# Patient Record
Sex: Male | Born: 1950
Health system: Southern US, Community
[De-identification: ages and names within clinical notes are randomized; demographics above are authoritative.]

## PROBLEM LIST (undated history)

## (undated) DIAGNOSIS — J309 Allergic rhinitis, unspecified: Secondary | ICD-10-CM

## (undated) DIAGNOSIS — E119 Type 2 diabetes mellitus without complications: Secondary | ICD-10-CM

## (undated) DIAGNOSIS — G2581 Restless legs syndrome: Secondary | ICD-10-CM

## (undated) DIAGNOSIS — K219 Gastro-esophageal reflux disease without esophagitis: Secondary | ICD-10-CM

## (undated) DIAGNOSIS — C801 Malignant (primary) neoplasm, unspecified: Secondary | ICD-10-CM

## (undated) DIAGNOSIS — E785 Hyperlipidemia, unspecified: Secondary | ICD-10-CM

## (undated) DIAGNOSIS — J45909 Unspecified asthma, uncomplicated: Secondary | ICD-10-CM

## (undated) DIAGNOSIS — I10 Essential (primary) hypertension: Secondary | ICD-10-CM

## (undated) DIAGNOSIS — J449 Chronic obstructive pulmonary disease, unspecified: Secondary | ICD-10-CM

## (undated) DIAGNOSIS — Z889 Allergy status to unspecified drugs, medicaments and biological substances status: Secondary | ICD-10-CM

## (undated) HISTORY — PX: VASECTOMY: SHX75

---

## 1983-11-29 HISTORY — PX: VASECTOMY: SHX75

## 2005-01-04 ENCOUNTER — Ambulatory Visit: Payer: Self-pay | Admitting: Gastroenterology

## 2005-01-04 LAB — HM COLONOSCOPY

## 2007-01-15 ENCOUNTER — Ambulatory Visit: Payer: Self-pay | Admitting: Unknown Physician Specialty

## 2007-11-02 ENCOUNTER — Ambulatory Visit: Payer: Self-pay | Admitting: Critical Care Medicine

## 2007-11-02 DIAGNOSIS — I10 Essential (primary) hypertension: Secondary | ICD-10-CM | POA: Insufficient documentation

## 2007-11-02 DIAGNOSIS — J329 Chronic sinusitis, unspecified: Secondary | ICD-10-CM | POA: Insufficient documentation

## 2007-11-02 DIAGNOSIS — J309 Allergic rhinitis, unspecified: Secondary | ICD-10-CM | POA: Insufficient documentation

## 2007-11-29 HISTORY — PX: NASAL SINUS SURGERY: SHX719

## 2011-01-27 ENCOUNTER — Ambulatory Visit: Payer: Self-pay | Admitting: General Practice

## 2011-02-21 ENCOUNTER — Ambulatory Visit: Payer: Self-pay | Admitting: Specialist

## 2011-04-12 NOTE — Assessment & Plan Note (Signed)
[facility]                             PULMONARY OFFICE NOTE   Macdonald, Brian                      MRN:          [id number]  DATE:11/02/2007                            DOB:          July 24, 1951    CHIEF COMPLAINT:  Wheezing and cough.   HISTORY OF PRESENT ILLNESS:  This is a 60 year old white male who is  here for evaluation of wheezing and cyclic cough.  Symptoms have been  going on since 08/2006.  He had a viral type syndrome at that time.  Had  initially a productive cough on an intermittent basis; sometimes  productive of green in the mornings.  He noted some hoarseness.  Denied  any dysphagia or sore throat.  There was no nocturnal cough.  He did  have some wheezing that appeared to be more in the upper portion of the  chest.  He is not particularly short of breath.  He has no chest pain.  he was given prednisone 5 or 6 times over the past year with some  improvement, and also has been on Advair now for 3 months with no real  change.  He smoked a cigar and do so monthly.  He has done this for the  last 5-10 years.  He works in the Engineer, mining at [employer].  No real exposure or environmental exposure history.  The  patient denies any irregular heart beats, acid symptoms.  There is no  loss of appetite.  No change in weight.  There has been no abdominal  pain, difficulty swallowing.  There is no sore throat, headaches, nasal  congestion, sneezing, itching, earache.  There has been no anxiety,  depression, hand or foot swelling, joint stiffness or pain.  The  patient's symptoms have progressed, and to the point where he is here  for further evaluation.  He had been seen previously by Dr. Meredeth Ide at  the [facility], who felt that he had asthmatic bronchitis as his  primary diagnosis.  He was tried on Xyzal as well as Advair and  albuterol p.r.n.  He really did not improve with these measures.  Pulmonary functions had  previously been done in the [facility] on  April 2008, showed essentially normal spirometry with not even a  reduction in FEV of 2575.  The June 2008 pulmonary functions also  confirm essentially normal pulmonary function studies.  Chest x-rays  have also been obtained, and are unremarkable.   Patient is referred for further evaluation.   PAST MEDICAL HISTORY:  Hypertension.  No history of emphysema, diabetes,  hypercholesterolemia.  There is no history of cancer, renal disease,  sleep disordered breathing.  He had sinus surgery in 12/2006 with  septoplasty being performed by the [facility].   MEDICATION ALLERGIES:  None.   CURRENT MEDICATIONS:  1. Advair 500/50 one spray b.i.d.  2. Amlodipine 10 mg daily.  3. Benazepril 40 mg daily.  4. Vytorin 10/20 daily.  5. Oracea 40 mg daily.  6. Bystolic 5 mg daily.   SOCIAL HISTORY:  Smoking history as noted above.  FAMILY HISTORY:  Father had bladder and prostate cancer.   REVIEW OF SYSTEMS:  Otherwise noncontributory.   PHYSICAL EXAM:  This is a middle-aged male, slightly overweight, in no  distress.  Temp 97.6, weight 245 pounds, blood pressure 120/80, pulse 61,  saturation 98 percent on room air.  CHEST:  Distant breath sounds, and no evidence of wheeze, rale or  rhonchi.  CARDIAC:  A regular rate and rhythm without S3.  Normal S1, S2.  ABDOMEN:  Soft, nontender.  EXTREMITIES:  No edema, clubbing or venous disease.  SKIN:  Clear.  NEUROLOGIC EXAM:  Intact.  HEENT:  No jugular venous distention, lymphadenopathy.  Oropharynx  clear.  NECK:  Supple.   Nares did show mild nasal inflammation without overt purulence.  Oropharynx was clear.   LABORATORY DATA:  Chest x-ray was clear.  Spirometry as noted above.  We  repeated spirometry today in the office, and again confirms the presence  of normal spirometry.  The FEV 2570 is normal at 100% predicted, FEV1 is  89% predicted, FVC of 88% predicted.   IMPRESSION:   Cyclic cough with upper airway instability syndrome, and no  true evidence of asthma or lower airway obstruction.  The patient has a  cyclic cough on the basis of a reflux mechanism likely, as well as  postnasal drip syndrome and probable allergic rhinitis.  He had overt  pseudowheeze on examination as noted.   RECOMMENDATIONS:  Obtain sinus CT scan.  Discontinue Advair.  Discontinue benazepril, as this could be likely contributing to cough  and lowering cough threshold as an ACE inhibitor, and replace this with  Benicar 40 mg daily.  Begin Astelin nasal spray 2 sprays b.i.d. each  nostril.  Begin the cyclic cough protocol with Tussionex, Tessalon  Perles.  Hold off on bronchoscopy, but may yet consider this.  We will  see the patient back in return followup in 1 month.     Brian Cradle Delford Field, MD, [facility]  Electronically Signed    PEW/MedQ  DD: 11/02/2007  DT: 11/03/2007  Job #: [id number]   cc:   Julieanne Manson

## 2011-09-22 ENCOUNTER — Ambulatory Visit: Payer: Self-pay | Admitting: Family Medicine

## 2011-11-13 IMAGING — CT CT CHEST W/ CM
1 series · 15 of 31 positions shown, 19 images · non-contrast
Comparison: none

REASON FOR EXAM: high resolution cough for several years  diabetic
COMMENTS:

[Series 2: hi res 5.0 i41f · axial · 0.79mm/px · z∈[-846,-546]mm · 15 of 66 slices shown, 19 images]
[im 3/66  mediastinal]
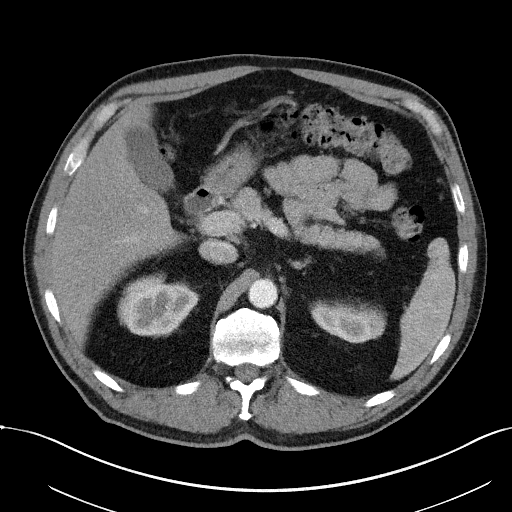
[im 3/66  lung]
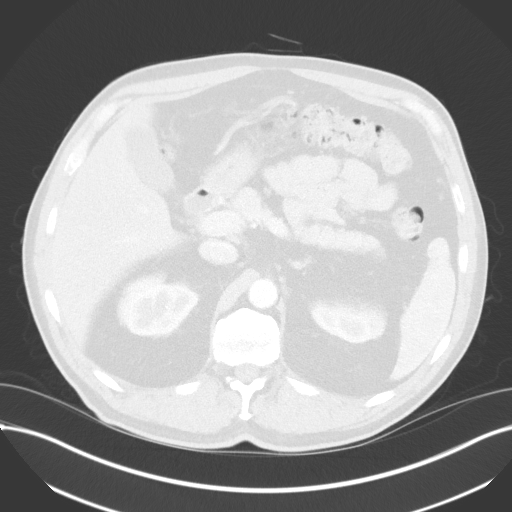
[im 8/66  lung]
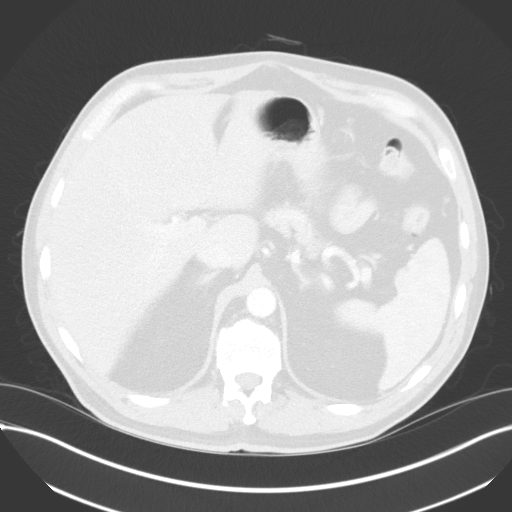
[im 13/66  lung]
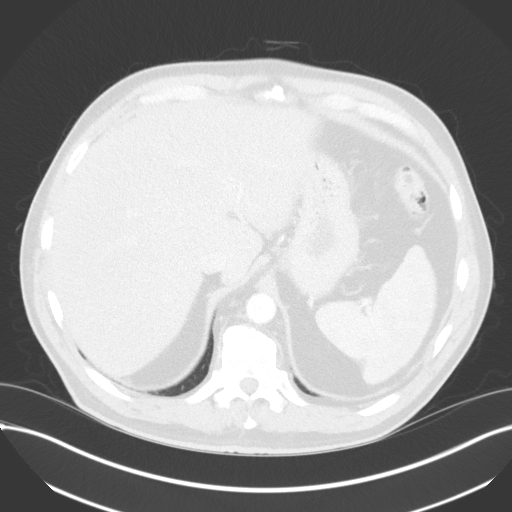
[im 15/66  lung]
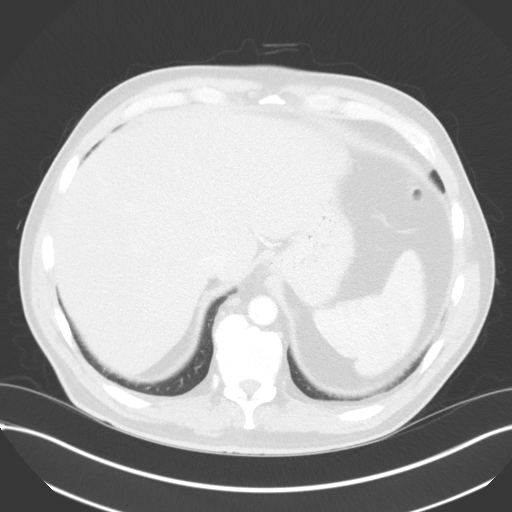
[im 20/66  mediastinal]
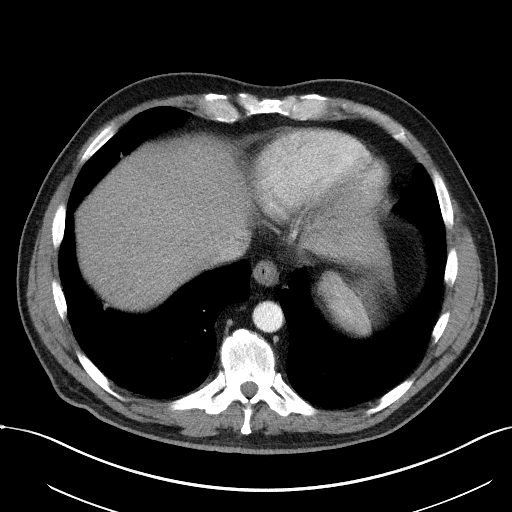
[im 20/66  lung]
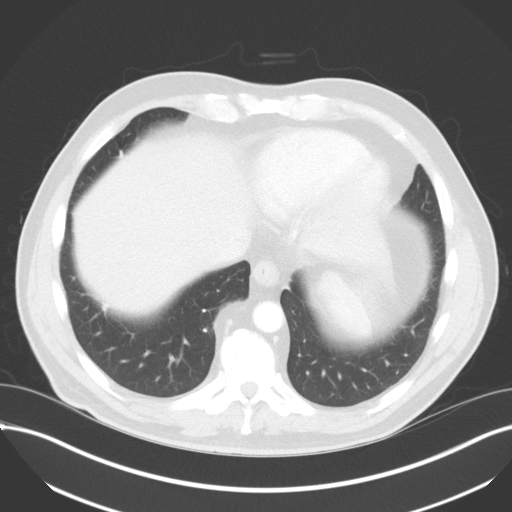
[im 25/66  lung]
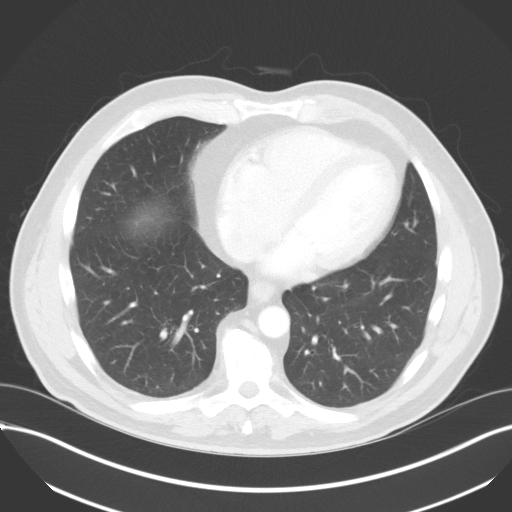
[im 29/66  lung]
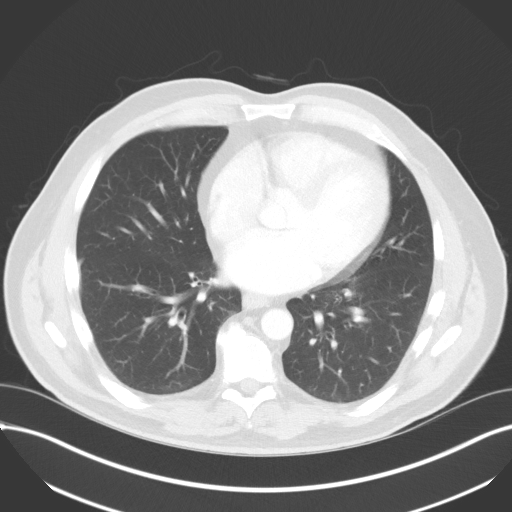
[im 34/66  lung]
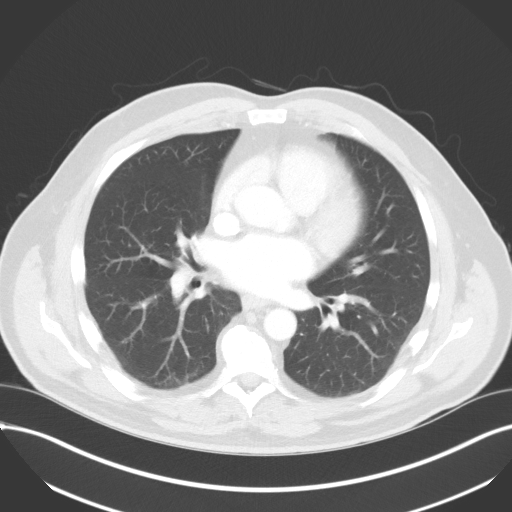
[im 37/66  mediastinal]
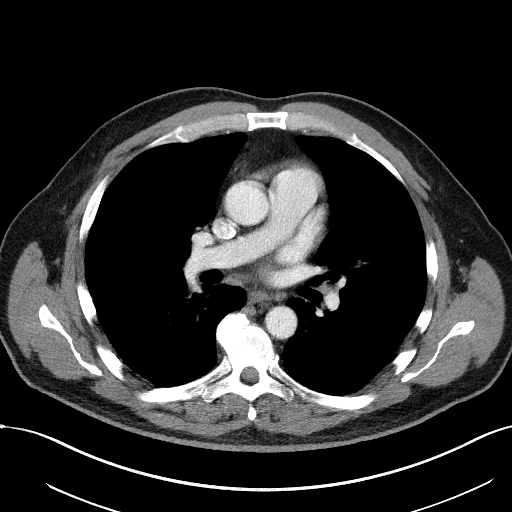
[im 37/66  lung]
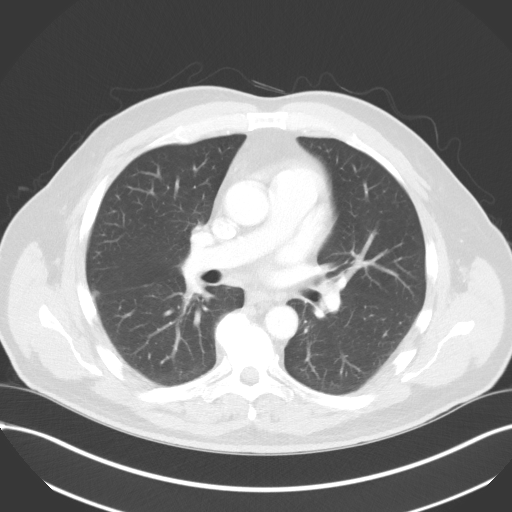
[im 40/66  lung]
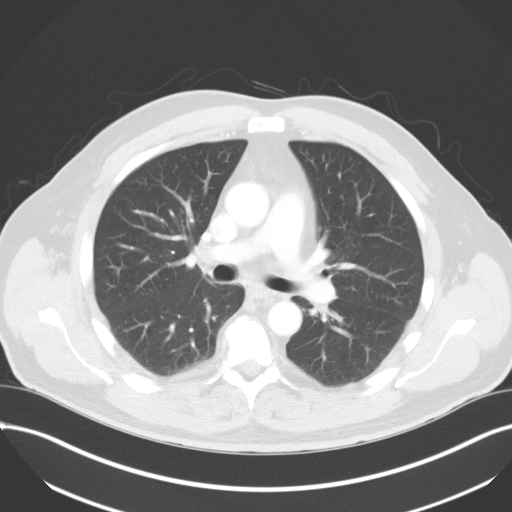
[im 44/66  lung]
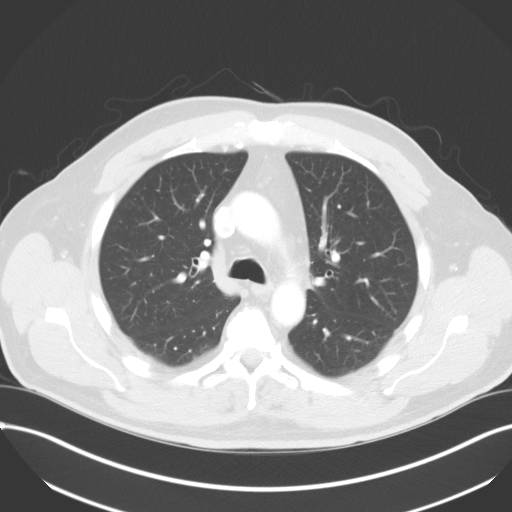
[im 49/66  lung]
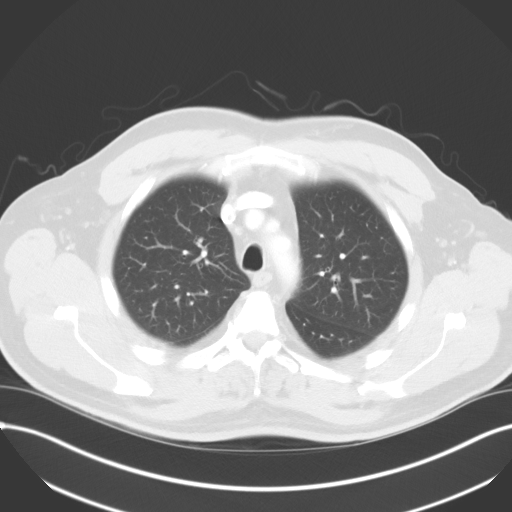
[im 53/66  mediastinal]
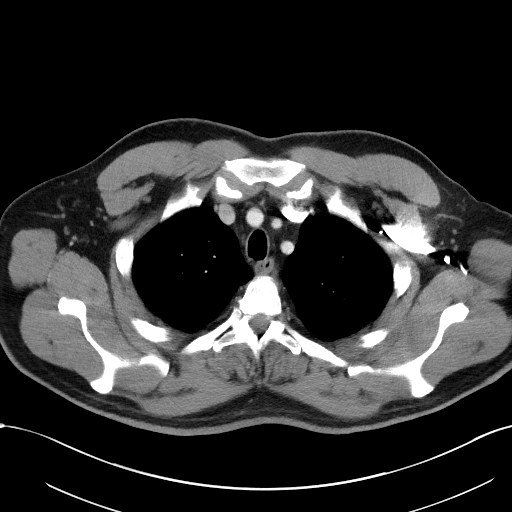
[im 53/66  lung]
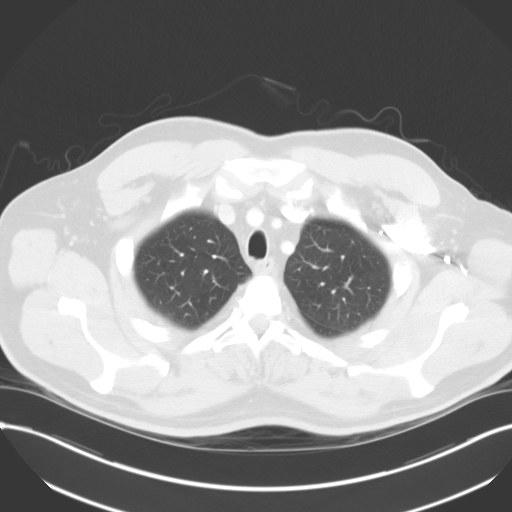
[im 58/66  lung]
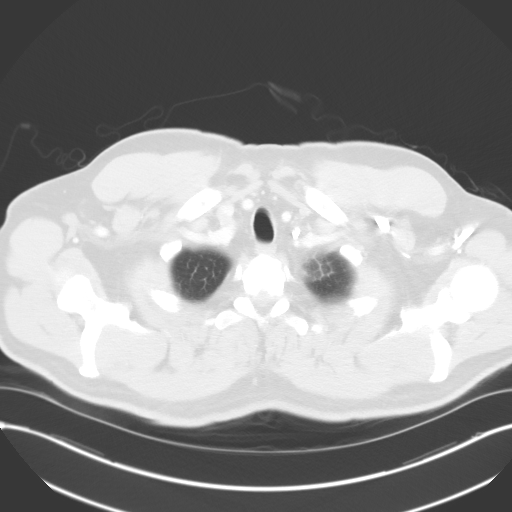
[im 63/66  lung]
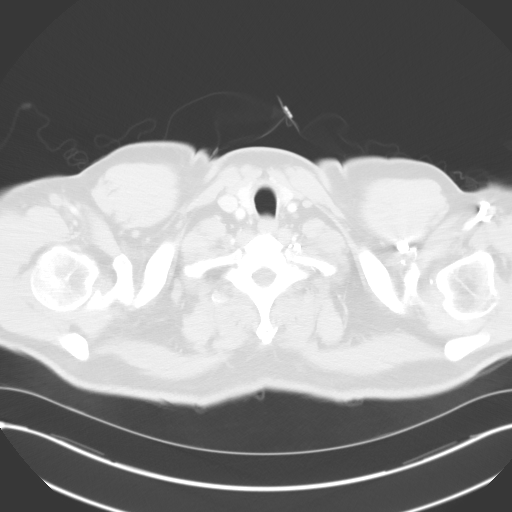

[15 of 31 positions shown; findings below may reference images not displayed]

PROCEDURE:     KCT - KCT CHEST WITH CONTRAST  - February 21, 2011  [DATE]

RESULT:     CT of the chest is performed utilizing 75 mL of 6sovue-OTG
iodinated intravenous contrast with images reconstructed at 5 mm slice
thickness in the axial plane. There is no previous similar study for
comparison.

Minimal subpleural nodular density is present in the left lower lobe on
image 44. No malignant characteristics are present. This area measures
approximately 3.0 mm in diameter. The lungs are otherwise clear.
High-resolution reconstructions demonstrate no significant septal thickening
or evidence of bronchiectasis. There is some slight peribronchial thickening
in the lower lobes centrally which could represent bronchitis. Correlate
clinically. No bullous disease is evident. There is no pneumothorax or
pleural effusion. The included upper abdominal structures appear
unremarkable.
IMPRESSION: Mild peribronchial thickening especially in the lower lobes bilaterally.
Correlate for possible bronchitis. Minimal subpleural nodular density in the
left lower lobe.

## 2011-12-30 HISTORY — PX: SKIN CANCER EXCISION: SHX779

## 2013-09-04 ENCOUNTER — Ambulatory Visit: Payer: Self-pay | Admitting: Family Medicine

## 2014-06-19 LAB — HEPATIC FUNCTION PANEL
ALK PHOS: 96 U/L (ref 25–125)
ALT: 22 U/L (ref 10–40)
AST: 24 U/L (ref 14–40)
BILIRUBIN, TOTAL: 0.7 mg/dL

## 2014-06-19 LAB — LIPID PANEL
Cholesterol: 129 mg/dL (ref 0–200)
HDL: 34 mg/dL — AB (ref 35–70)
LDL Cholesterol: 61 mg/dL
LDL/HDL RATIO: 1.8
TRIGLYCERIDES: 171 mg/dL — AB (ref 40–160)

## 2014-06-19 LAB — BASIC METABOLIC PANEL
BUN: 16 mg/dL (ref 4–21)
CREATININE: 1.1 mg/dL (ref 0.6–1.3)
Glucose: 130 mg/dL
Potassium: 4.6 mmol/L (ref 3.4–5.3)
Sodium: 139 mmol/L (ref 137–147)

## 2014-06-19 LAB — CBC AND DIFFERENTIAL
HCT: 45 % (ref 41–53)
Hemoglobin: 15.5 g/dL (ref 13.5–17.5)
Neutrophils Absolute: 5 /uL
Platelets: 220 10*3/uL (ref 150–399)
WBC: 7.2 10*3/mL

## 2014-06-19 LAB — TSH: TSH: 2.17 u[IU]/mL (ref 0.41–5.90)

## 2014-08-28 LAB — HM PAP SMEAR

## 2015-02-04 LAB — HEMOGLOBIN A1C: Hgb A1c MFr Bld: 6.5 % — AB (ref 4.0–6.0)

## 2015-02-19 ENCOUNTER — Ambulatory Visit: Payer: Self-pay | Admitting: Medical

## 2015-06-06 DIAGNOSIS — I10 Essential (primary) hypertension: Secondary | ICD-10-CM | POA: Insufficient documentation

## 2015-06-06 DIAGNOSIS — Z9189 Other specified personal risk factors, not elsewhere classified: Secondary | ICD-10-CM | POA: Insufficient documentation

## 2015-06-06 DIAGNOSIS — N529 Male erectile dysfunction, unspecified: Secondary | ICD-10-CM | POA: Insufficient documentation

## 2015-06-06 DIAGNOSIS — R0683 Snoring: Secondary | ICD-10-CM | POA: Insufficient documentation

## 2015-06-06 DIAGNOSIS — E291 Testicular hypofunction: Secondary | ICD-10-CM | POA: Insufficient documentation

## 2015-06-06 DIAGNOSIS — E785 Hyperlipidemia, unspecified: Secondary | ICD-10-CM | POA: Insufficient documentation

## 2015-06-06 DIAGNOSIS — E119 Type 2 diabetes mellitus without complications: Secondary | ICD-10-CM | POA: Insufficient documentation

## 2015-06-06 DIAGNOSIS — J45909 Unspecified asthma, uncomplicated: Secondary | ICD-10-CM | POA: Insufficient documentation

## 2015-06-06 DIAGNOSIS — N400 Enlarged prostate without lower urinary tract symptoms: Secondary | ICD-10-CM | POA: Insufficient documentation

## 2015-06-06 DIAGNOSIS — K219 Gastro-esophageal reflux disease without esophagitis: Secondary | ICD-10-CM | POA: Insufficient documentation

## 2015-07-09 ENCOUNTER — Ambulatory Visit (INDEPENDENT_AMBULATORY_CARE_PROVIDER_SITE_OTHER): Payer: 59 | Admitting: Family Medicine

## 2015-07-09 ENCOUNTER — Encounter: Payer: Self-pay | Admitting: Family Medicine

## 2015-07-09 VITALS — BP 120/78 | HR 72 | Temp 97.5°F | Resp 16 | Ht 74.0 in | Wt 229.0 lb

## 2015-07-09 DIAGNOSIS — E1169 Type 2 diabetes mellitus with other specified complication: Secondary | ICD-10-CM

## 2015-07-09 DIAGNOSIS — I10 Essential (primary) hypertension: Secondary | ICD-10-CM

## 2015-07-09 DIAGNOSIS — Z0000 Encounter for general adult medical examination without abnormal findings: Secondary | ICD-10-CM

## 2015-07-09 DIAGNOSIS — Z1211 Encounter for screening for malignant neoplasm of colon: Secondary | ICD-10-CM

## 2015-07-09 DIAGNOSIS — E785 Hyperlipidemia, unspecified: Secondary | ICD-10-CM

## 2015-07-09 LAB — POCT UA - MICROALBUMIN: Microalbumin Ur, POC: 50 mg/L

## 2015-07-09 NOTE — Progress Notes (Signed)
Patient ID: Brian Macdonald, male   DOB: 10/10/1951, 64 y.o.   MRN: [id number]       Patient: Brian Macdonald, Male    DOB: 11-21-1951, 64 y.o.   MRN: [id number] Visit Date: 07/09/2015  Today's Provider: Wilhemena Durie, MD   Chief Complaint  Patient presents with  . Annual Exam   Subjective:    Annual physical exam Brian Macdonald is a 64 y.o. male who presents today for health maintenance and complete physical. He feels well. He reports exercising about 5 days a week, walks 30 minutes. He also plays golf.Marland Kitchen He reports he is sleeping well.  -----------------------------------------------------------------   Review of Systems  All other systems reviewed and are negative.   Social History He  reports that he has never smoked. He does not have any smokeless tobacco history on file. He reports that he drinks alcohol. He reports that he does not use illicit drugs.  Patient Active Problem List   Diagnosis Date Noted  . Asthma with allergic rhinitis 06/06/2015  . At risk for osteoporosis 06/06/2015  . Benign prostatic hypertrophy without urinary obstruction 06/06/2015  . Diabetes mellitus, type 2 06/06/2015  . Essential (primary) hypertension 06/06/2015  . Gastro-esophageal reflux disease without esophagitis 06/06/2015  . HLD (hyperlipidemia) 06/06/2015  . Failure of erection 06/06/2015  . Snores 06/06/2015  . Testicular hypofunction 06/06/2015  . HYPERTENSION 11/02/2007  . SINUSITIS CHRONIC, UNSPECIFIED 11/02/2007  . ALLERGIC RHINITIS 11/02/2007    Past Surgical History  Procedure Laterality Date  . Skin cancer excision  12/2011    basal cell carcinoma  . Nasal sinus surgery  2008/03/17    Due to sinus infection, breathing problems and passage was " messed up"  . Vasectomy  1985    Family History  Family Status  Relation Status Death Age  . Mother Alive   . Father Deceased     died in 03/18/07 from bladder cancer  . Sister Alive   . Brother Alive     . Sister Alive   . Brother Alive     His family history includes Diabetes in his mother; Hypertension in his mother.    Not on File  Previous Medications   AMLODIPINE (NORVASC) 10 MG TABLET    Take by mouth.   ASPIRIN 81 MG EC TABLET    Take by mouth.   CARVEDILOL (COREG) 25 MG TABLET    Take by mouth.   FLUTICASONE (FLONASE) 50 MCG/ACT NASAL SPRAY    Place into the nose.   HYDROCHLOROTHIAZIDE (HYDRODIURIL) 25 MG TABLET    Take by mouth.   LORATADINE (CLARITIN REDITABS) 10 MG DISSOLVABLE TABLET    Take by mouth.   LOSARTAN (COZAAR) 100 MG TABLET    Take by mouth.   METFORMIN (GLUCOPHAGE) 1000 MG TABLET    Take by mouth.   MONTELUKAST (SINGULAIR) 10 MG TABLET    Take by mouth.   MULTIPLE VITAMIN PO    Take by mouth.   OMEPRAZOLE 20 MG TBEC    Take by mouth.   PREDNISONE (STERAPRED UNI-PAK 21 TAB) 5 MG (21) TBPK TABLET    Take by mouth.   SILDENAFIL (VIAGRA) 100 MG TABLET    Take by mouth.   SIMVASTATIN (ZOCOR) 20 MG TABLET    Take by mouth.   TAMSULOSIN (FLOMAX) 0.4 MG CAPS CAPSULE    Take by mouth.   TESTOSTERONE (AXIRON) 30 MG/ACT SOLN    Place onto the skin.    Patient Care  Team: Jerrol Banana., MD as PCP - General (Family Medicine)     Objective:   Vitals: BP 120/78 mmHg  Pulse 72  Temp(Src) 97.5 F (36.4 C) (Oral)  Resp 16  Ht 6\' 2"  (1.88 m)  Wt 229 lb (103.874 kg)  BMI 29.39 kg/m2   Physical Exam  Constitutional: He is oriented to person, place, and time. He appears well-developed and well-nourished.  HENT:  Head: Normocephalic and atraumatic.  Right Ear: External ear normal.  Left Ear: External ear normal.  Nose: Nose normal.  Mouth/Throat: Oropharynx is clear and moist.  Eyes: Conjunctivae and EOM are normal. Pupils are equal, round, and reactive to light.  Neck: Normal range of motion. Neck supple.  Cardiovascular: Normal rate, regular rhythm, normal heart sounds and intact distal pulses.   Pulmonary/Chest: Effort normal and breath sounds normal.   Abdominal: Soft. Bowel sounds are normal.  Genitourinary: Rectum normal, prostate normal and penis normal.  Musculoskeletal: Normal range of motion.  Neurological: He is alert and oriented to person, place, and time. He has normal reflexes.  Diabetic foot exam with fine monofilament today is normal.  Skin: Skin is warm and dry.  Patient well tanned. Chronic rosacea type changes of the face noted. He does see dermatology.  Psychiatric: He has a normal mood and affect. His behavior is normal. Judgment and thought content normal.   GU and DRE exam refer defered to Dr. Yves Dill who did this about 3 months ago.   Depression Screen No flowsheet data found.    Assessment & Plan:     Routine Health Maintenance and Physical Exam  Exercise Activities and Dietary recommendations Goals    None      Immunization History  Administered Date(s) Administered  . Pneumococcal Conjugate-13 02/04/2015  . Pneumococcal Polysaccharide-23 07/25/2011  . Td 04/15/2004  . Tdap 07/25/2011  . Zoster 10/12/2011    Health Maintenance  Topic Date Due  . Hepatitis C Screening  16-Dec-1950  . FOOT EXAM  08/16/1959  . OPHTHALMOLOGY EXAM  08/16/1959  . URINE MICROALBUMIN  08/16/1959  . HIV Screening  08/15/1964  . COLONOSCOPY  01/04/2015  . INFLUENZA VACCINE  06/29/2015  . HEMOGLOBIN A1C  08/07/2015  . PNA vac Low Risk Adult (2 of 2 - PPSV23) 07/24/2016  . TETANUS/TDAP  07/24/2021  . ZOSTAVAX  Completed   Refer for screening colonoscopy. Last done in 2006.   Discussed health benefits of physical activity, and encouraged him to engage in regular exercise appropriate for his age and condition.    --------------------------------------------------------------------

## 2015-07-10 LAB — CBC WITH DIFFERENTIAL/PLATELET
Basophils Absolute: 0 10*3/uL (ref 0.0–0.2)
Basos: 0 %
EOS (ABSOLUTE): 0.3 10*3/uL (ref 0.0–0.4)
Eos: 3 %
Hematocrit: 44.6 % (ref 37.5–51.0)
Hemoglobin: 15.1 g/dL (ref 12.6–17.7)
IMMATURE GRANS (ABS): 0 10*3/uL (ref 0.0–0.1)
IMMATURE GRANULOCYTES: 0 %
Lymphocytes Absolute: 1.4 10*3/uL (ref 0.7–3.1)
Lymphs: 16 %
MCH: 32.3 pg (ref 26.6–33.0)
MCHC: 33.9 g/dL (ref 31.5–35.7)
MCV: 96 fL (ref 79–97)
MONOCYTES: 6 %
Monocytes Absolute: 0.5 10*3/uL (ref 0.1–0.9)
NEUTROS ABS: 6.5 10*3/uL (ref 1.4–7.0)
Neutrophils: 75 %
PLATELETS: 245 10*3/uL (ref 150–379)
RBC: 4.67 x10E6/uL (ref 4.14–5.80)
RDW: 14.1 % (ref 12.3–15.4)
WBC: 8.7 10*3/uL (ref 3.4–10.8)

## 2015-07-10 LAB — COMPREHENSIVE METABOLIC PANEL
A/G RATIO: 2.2 (ref 1.1–2.5)
ALBUMIN: 4.6 g/dL (ref 3.6–4.8)
ALT: 26 IU/L (ref 0–44)
AST: 26 IU/L (ref 0–40)
Alkaline Phosphatase: 88 IU/L (ref 39–117)
BILIRUBIN TOTAL: 0.7 mg/dL (ref 0.0–1.2)
BUN/Creatinine Ratio: 12 (ref 10–22)
BUN: 13 mg/dL (ref 8–27)
CHLORIDE: 96 mmol/L — AB (ref 97–108)
CO2: 30 mmol/L — AB (ref 18–29)
Calcium: 9.4 mg/dL (ref 8.6–10.2)
Creatinine, Ser: 1.12 mg/dL (ref 0.76–1.27)
GFR calc non Af Amer: 69 mL/min/{1.73_m2} (ref >59)
GFR, EST AFRICAN AMERICAN: 79 mL/min/{1.73_m2} (ref >59)
GLUCOSE: 136 mg/dL — AB (ref 65–99)
Globulin, Total: 2.1 g/dL (ref 1.5–4.5)
Potassium: 4.6 mmol/L (ref 3.5–5.2)
Sodium: 139 mmol/L (ref 134–144)
TOTAL PROTEIN: 6.7 g/dL (ref 6.0–8.5)

## 2015-07-10 LAB — HEMOGLOBIN A1C
Est. average glucose Bld gHb Est-mCnc: 146 mg/dL
Hgb A1c MFr Bld: 6.7 % — ABNORMAL HIGH (ref 4.8–5.6)

## 2015-07-10 LAB — LIPID PANEL WITH LDL/HDL RATIO
Cholesterol, Total: 131 mg/dL (ref 100–199)
HDL: 30 mg/dL — AB (ref >39)
LDL Calculated: 63 mg/dL (ref 0–99)
LDl/HDL Ratio: 2.1 ratio units (ref 0.0–3.6)
Triglycerides: 188 mg/dL — ABNORMAL HIGH (ref 0–149)
VLDL CHOLESTEROL CAL: 38 mg/dL (ref 5–40)

## 2015-07-10 LAB — TSH: TSH: 1.72 u[IU]/mL (ref 0.450–4.500)

## 2015-07-14 ENCOUNTER — Telehealth: Payer: Self-pay | Admitting: Family Medicine

## 2015-07-14 NOTE — Telephone Encounter (Signed)
Brian Macdonald Brian Macdonald returned your call and left his cell phone number for you to call him back on.  [phone]  Thanks Brian Macdonald

## 2015-07-14 NOTE — Telephone Encounter (Signed)
Pt advised of lab results-aa 

## 2015-07-27 ENCOUNTER — Other Ambulatory Visit: Payer: Self-pay | Admitting: Family Medicine

## 2015-07-27 MED ORDER — LOSARTAN POTASSIUM 100 MG PO TABS: 100.0000 mg | ORAL_TABLET | Freq: Every day | ORAL | Status: DC

## 2015-07-27 NOTE — Telephone Encounter (Signed)
Okay to do this. 

## 2015-07-27 NOTE — Telephone Encounter (Signed)
Done-aa 

## 2015-07-27 NOTE — Telephone Encounter (Signed)
Pt needs 1 30 day Rx for Losartan 100 mg.  [place].  Mailorder will not get here in time.  Thanks, [employer]

## 2015-07-27 NOTE — Telephone Encounter (Signed)
Ok to send this 30 day supply since his mail order can not get it to him before he runs out?

## 2015-08-27 DIAGNOSIS — J4520 Mild intermittent asthma, uncomplicated: Secondary | ICD-10-CM | POA: Insufficient documentation

## 2015-09-02 ENCOUNTER — Ambulatory Visit (INDEPENDENT_AMBULATORY_CARE_PROVIDER_SITE_OTHER): Payer: 59 | Admitting: Family Medicine

## 2015-09-02 ENCOUNTER — Encounter: Payer: Self-pay | Admitting: Family Medicine

## 2015-09-02 VITALS — BP 114/58 | HR 68 | Temp 98.4°F | Resp 10 | Wt 231.0 lb

## 2015-09-02 DIAGNOSIS — K439 Ventral hernia without obstruction or gangrene: Secondary | ICD-10-CM

## 2015-09-02 DIAGNOSIS — E784 Other hyperlipidemia: Secondary | ICD-10-CM

## 2015-09-02 DIAGNOSIS — E785 Hyperlipidemia, unspecified: Secondary | ICD-10-CM

## 2015-09-02 NOTE — Progress Notes (Signed)
Patient ID: Brian Macdonald, male   DOB: 08-14-1951, 64 y.o.   MRN: [id number]    Subjective:  HPI  Patient is here to discuss a few things.  Hernia- patient states he had US done a few month ago for his abdomen and was found to have hernia but he did not discuss this with [place] before and wanted to talk about that. The reason he had US done was due to having abdominal bruising at that time and that was ordered by Nira Conn PA at [place]. He does not have pain with this now but when he tries to sit up from laying position he gets a knot/bump that comes up above the navel area.  Labs- he had wellness done through [employer] [facility] [facility] and we had ordered labs for him in August during CPE and everything looked ok but his HDL was 30 and he wanted to discuss what to do to try and bring that up. He does exercise 30 minutes 5 days a week some days more and some days a little less.  Pulmonary issues-he saw Dr. Raul Del and states he is trying to have patient try inhalers instead of prednisone due to risks of long term use.  Prior to Admission medications   Medication Sig Start Date End Date Taking? Authorizing Provider  amLODipine (NORVASC) 10 MG tablet Take by mouth. 03/19/15   Historical Provider, MD  Aspirin 81 MG EC tablet Take by mouth. 12/08/10   Historical Provider, MD  carvedilol (COREG) 25 MG tablet Take by mouth. 04/02/15   Historical Provider, MD  fluticasone (FLONASE) 50 MCG/ACT nasal spray Place into the nose.    Historical Provider, MD  hydrochlorothiazide (HYDRODIURIL) 25 MG tablet Take by mouth. 10/28/14   Historical Provider, MD  loratadine (CLARITIN REDITABS) 10 MG dissolvable tablet Take by mouth. 12/08/10   Historical Provider, MD  losartan (COZAAR) 100 MG tablet Take 1 tablet (100 mg total) by mouth daily. 07/27/15   Jerrol Banana., MD  metFORMIN (GLUCOPHAGE) 1000 MG tablet Take by mouth. 06/18/14   Historical Provider, MD  montelukast (SINGULAIR) 10 MG tablet Take by mouth.    Historical  Provider, MD  MULTIPLE VITAMIN PO Take by mouth. 12/08/10   Historical Provider, MD  Omeprazole 20 MG TBEC Take by mouth. 02/18/15   Historical Provider, MD  predniSONE (STERAPRED UNI-PAK 21 TAB) 5 MG (21) TBPK tablet Take by mouth.    Historical Provider, MD  sildenafil (VIAGRA) 100 MG tablet Take by mouth. 06/18/14   Historical Provider, MD  simvastatin (ZOCOR) 20 MG tablet Take by mouth. 10/28/14   Historical Provider, MD  tamsulosin (FLOMAX) 0.4 MG CAPS capsule Take by mouth. 02/18/15   Historical Provider, MD  Testosterone (AXIRON) 30 MG/ACT SOLN Place onto the skin.    Historical Provider, MD    Patient Active Problem List   Diagnosis Date Noted  . Asthma with allergic rhinitis 06/06/2015  . At risk for osteoporosis 06/06/2015  . Benign prostatic hypertrophy without urinary obstruction 06/06/2015  . Diabetes mellitus, type 2 ([place]) 06/06/2015  . Essential (primary) hypertension 06/06/2015  . Gastro-esophageal reflux disease without esophagitis 06/06/2015  . HLD (hyperlipidemia) 06/06/2015  . Failure of erection 06/06/2015  . Snores 06/06/2015  . Testicular hypofunction 06/06/2015  . HYPERTENSION 11/02/2007  . SINUSITIS CHRONIC, UNSPECIFIED 11/02/2007  . ALLERGIC RHINITIS 11/02/2007    No past medical history on file.  Social History   Social History  . Marital Status: Married    Spouse Name: N/A  .  Number of Children: N/A  . Years of Education: N/A   Occupational History  . Not on file.   Social History Main Topics  . Smoking status: Never Smoker   . Smokeless tobacco: Never Used  . Alcohol Use: Yes     Comment: "about every day" " Doctor told me to have 2 drinks a night"  . Drug Use: No  . Sexual Activity: Not on file   Other Topics Concern  . Not on file   Social History Narrative    No Known Allergies  Review of Systems  Constitutional: Negative.   Eyes: Negative.   Respiratory: Negative.   Cardiovascular: Negative.   Gastrointestinal: Negative.     Genitourinary: Negative.   Musculoskeletal: Negative.   Neurological: Negative.   Endo/Heme/Allergies: Negative.   Psychiatric/Behavioral: Negative.     Immunization History  Administered Date(s) Administered  . Pneumococcal Conjugate-13 02/04/2015  . Pneumococcal Polysaccharide-23 07/25/2011  . Td 04/15/2004  . Tdap 07/25/2011  . Zoster 10/12/2011   Objective:  BP 114/58 mmHg  Pulse 68  Temp(Src) 98.4 F (36.9 C)  Resp 10  Wt 231 lb (104.781 kg)  Physical Exam  Constitutional: He is oriented to person, place, and time and well-developed, well-nourished, and in no distress.  HENT:  Head: Normocephalic and atraumatic.  Right Ear: External ear normal.  Left Ear: External ear normal.  Nose: Nose normal.  Eyes: Conjunctivae are normal.  Neck: Neck supple.  Cardiovascular: Normal rate, regular rhythm and normal heart sounds.   Pulmonary/Chest: Effort normal and breath sounds normal.  Abdominal: Soft.  Neurological: He is alert and oriented to person, place, and time.  Skin: Skin is warm and dry.  Psychiatric: Mood, memory, affect and judgment normal.    Lab Results  Component Value Date   WBC 8.7 07/09/2015   HGB 15.5 06/19/2014   HCT 44.6 07/09/2015   PLT 220 06/19/2014   GLUCOSE 136* 07/09/2015   CHOL 131 07/09/2015   TRIG 188* 07/09/2015   HDL 30* 07/09/2015   LDLCALC 63 07/09/2015   TSH 1.720 07/09/2015   HGBA1C 6.7* 07/09/2015    CMP     Component Value Date/Time   NA 139 07/09/2015 1029   K 4.6 07/09/2015 1029   CL 96* 07/09/2015 1029   CO2 30* 07/09/2015 1029   GLUCOSE 136* 07/09/2015 1029   BUN 13 07/09/2015 1029   CREATININE 1.12 07/09/2015 1029   CREATININE 1.1 06/19/2014   CALCIUM 9.4 07/09/2015 1029   PROT 6.7 07/09/2015 1029   AST 26 07/09/2015 1029   ALT 26 07/09/2015 1029   ALKPHOS 88 07/09/2015 1029   BILITOT 0.7 07/09/2015 1029   GFRNONAA 69 07/09/2015 1029   GFRAA 79 07/09/2015 1029    Assessment and Plan :  Ventral hernia   Asymptomatic.No action needed now. Low HDL Discussed exercise and 1 drink daily . Consider fish oil and Niacin OTC daily. Miguel Aschoff MD [facility] 09/02/2015 10:41 AM

## 2015-09-10 ENCOUNTER — Encounter: Payer: Self-pay | Admitting: *Deleted

## 2015-09-11 ENCOUNTER — Ambulatory Visit: Payer: 59 | Admitting: Anesthesiology

## 2015-09-11 ENCOUNTER — Encounter
Admission: RE | Disposition: A | Discharge status: Home / Self Care | Payer: Self-pay | Source: Ambulatory Visit | Attending: Gastroenterology

## 2015-09-11 ENCOUNTER — Ambulatory Visit
Admission: RE | Admit: 2015-09-11 | Discharge: 2015-09-11 | Disposition: A | Discharge status: Home / Self Care | Payer: 59 | Source: Ambulatory Visit | Attending: Gastroenterology | Admitting: Gastroenterology

## 2015-09-11 DIAGNOSIS — K648 Other hemorrhoids: Secondary | ICD-10-CM | POA: Diagnosis not present

## 2015-09-11 DIAGNOSIS — K644 Residual hemorrhoidal skin tags: Secondary | ICD-10-CM | POA: Diagnosis not present

## 2015-09-11 DIAGNOSIS — J449 Chronic obstructive pulmonary disease, unspecified: Secondary | ICD-10-CM | POA: Insufficient documentation

## 2015-09-11 DIAGNOSIS — K219 Gastro-esophageal reflux disease without esophagitis: Secondary | ICD-10-CM | POA: Diagnosis not present

## 2015-09-11 DIAGNOSIS — Z7982 Long term (current) use of aspirin: Secondary | ICD-10-CM | POA: Diagnosis not present

## 2015-09-11 DIAGNOSIS — D123 Benign neoplasm of transverse colon: Secondary | ICD-10-CM | POA: Insufficient documentation

## 2015-09-11 DIAGNOSIS — E119 Type 2 diabetes mellitus without complications: Secondary | ICD-10-CM | POA: Diagnosis not present

## 2015-09-11 DIAGNOSIS — K5730 Diverticulosis of large intestine without perforation or abscess without bleeding: Secondary | ICD-10-CM | POA: Insufficient documentation

## 2015-09-11 DIAGNOSIS — J45909 Unspecified asthma, uncomplicated: Secondary | ICD-10-CM | POA: Diagnosis not present

## 2015-09-11 DIAGNOSIS — Z8371 Family history of colonic polyps: Secondary | ICD-10-CM | POA: Insufficient documentation

## 2015-09-11 DIAGNOSIS — I10 Essential (primary) hypertension: Secondary | ICD-10-CM | POA: Insufficient documentation

## 2015-09-11 DIAGNOSIS — G2581 Restless legs syndrome: Secondary | ICD-10-CM | POA: Diagnosis not present

## 2015-09-11 DIAGNOSIS — Z1211 Encounter for screening for malignant neoplasm of colon: Secondary | ICD-10-CM | POA: Diagnosis not present

## 2015-09-11 HISTORY — DX: Unspecified asthma, uncomplicated: J45.909

## 2015-09-11 HISTORY — DX: Hyperlipidemia, unspecified: E78.5

## 2015-09-11 HISTORY — DX: Gastro-esophageal reflux disease without esophagitis: K21.9

## 2015-09-11 HISTORY — DX: Malignant (primary) neoplasm, unspecified: C80.1

## 2015-09-11 HISTORY — PX: COLONOSCOPY WITH PROPOFOL: SHX5780

## 2015-09-11 HISTORY — DX: Allergy status to unspecified drugs, medicaments and biological substances: Z88.9

## 2015-09-11 HISTORY — DX: Restless legs syndrome: G25.81

## 2015-09-11 HISTORY — DX: Chronic obstructive pulmonary disease, unspecified: J44.9

## 2015-09-11 HISTORY — DX: Type 2 diabetes mellitus without complications: E11.9

## 2015-09-11 HISTORY — DX: Allergic rhinitis, unspecified: J30.9

## 2015-09-11 HISTORY — DX: Essential (primary) hypertension: I10

## 2015-09-11 LAB — HM COLONOSCOPY

## 2015-09-11 SURGERY — COLONOSCOPY WITH PROPOFOL
Anesthesia: General

## 2015-09-11 MED ORDER — LIDOCAINE HCL (CARDIAC) 20 MG/ML IV SOLN
INTRAVENOUS | Status: DC | PRN
  Administered 2015-09-11: 80 mg via INTRAVENOUS

## 2015-09-11 MED ORDER — PROPOFOL 500 MG/50ML IV EMUL
INTRAVENOUS | Status: DC | PRN
  Administered 2015-09-11: 20 ug/kg/min via INTRAVENOUS

## 2015-09-11 MED ORDER — MIDAZOLAM HCL 2 MG/2ML IJ SOLN
INTRAMUSCULAR | Status: DC | PRN
  Administered 2015-09-11: 2 mg via INTRAVENOUS

## 2015-09-11 MED ORDER — EPHEDRINE SULFATE 50 MG/ML IJ SOLN
INTRAMUSCULAR | Status: DC | PRN
Start: 2015-09-11 — End: 2015-09-11
  Administered 2015-09-11: 10 mg via INTRAVENOUS

## 2015-09-11 MED ORDER — SODIUM CHLORIDE 0.9 % IV SOLN
INTRAVENOUS | Status: DC
  Administered 2015-09-11: 1000 mL via INTRAVENOUS

## 2015-09-11 NOTE — H&P (Signed)
Primary Care Physician:  Wilhemena Durie, MD  Pre-Procedure History & Physical: HPI:  Brian Macdonald is a 64 y.o. male is here for an colonoscopy.   Past Medical History  Diagnosis Date  . Multiple allergies   . Diabetes mellitus without complication ([place])   . GERD (gastroesophageal reflux disease)   . Hypertension   . Restless leg syndrome   . Asthma   . Lipids blood increased   . Rhinitis, allergic   . COPD (chronic obstructive pulmonary disease) ([place])   . Cancer [facility])     Past Surgical History  Procedure Laterality Date  . Skin cancer excision  12/2011    basal cell carcinoma  . Nasal sinus surgery  2009    Due to sinus infection, breathing problems and passage was " messed up"  . Vasectomy  1985  . Vasectomy      Prior to Admission medications   Medication Sig Start Date End Date Taking? Authorizing Provider  amLODipine (NORVASC) 10 MG tablet Take by mouth. 03/19/15  Yes Historical Provider, MD  carvedilol (COREG) 25 MG tablet Take by mouth. 04/02/15  Yes Historical Provider, MD  hydrochlorothiazide (HYDRODIURIL) 25 MG tablet Take by mouth. 10/28/14  Yes Historical Provider, MD  Aspirin 81 MG EC tablet Take by mouth. 12/08/10   Historical Provider, MD  fluticasone (FLONASE) 50 MCG/ACT nasal spray Place into the nose.    Historical Provider, MD  losartan (COZAAR) 100 MG tablet Take 1 tablet (100 mg total) by mouth daily. 07/27/15   Jerrol Banana., MD  metFORMIN (GLUCOPHAGE) 1000 MG tablet Take by mouth. 06/18/14   Historical Provider, MD  montelukast (SINGULAIR) 10 MG tablet Take by mouth.    Historical Provider, MD  MULTIPLE VITAMIN PO Take by mouth. 12/08/10   Historical Provider, MD  Omeprazole 20 MG TBEC Take by mouth. 02/18/15   Historical Provider, MD  predniSONE (STERAPRED UNI-PAK 21 TAB) 5 MG (21) TBPK tablet Take by mouth.    Historical Provider, MD  sildenafil (VIAGRA) 100 MG tablet Take by mouth. 06/18/14   Historical Provider, MD  simvastatin (ZOCOR) 20 MG  tablet Take by mouth. 10/28/14   Historical Provider, MD  tamsulosin (FLOMAX) 0.4 MG CAPS capsule Take by mouth. 02/18/15   Historical Provider, MD  Testosterone (AXIRON) 30 MG/ACT SOLN Place onto the skin.    Historical Provider, MD    Allergies as of 08/17/2015  . (Not on File)    Family History  Problem Relation Age of Onset  . Diabetes Mother   . Hypertension Mother     Social History   Social History  . Marital Status: Married    Spouse Name: N/A  . Number of Children: N/A  . Years of Education: N/A   Occupational History  . Not on file.   Social History Main Topics  . Smoking status: Never Smoker   . Smokeless tobacco: Never Used  . Alcohol Use: Yes     Comment: "about every day" " Doctor told me to have 2 drinks a night"  . Drug Use: No  . Sexual Activity: Not on file   Other Topics Concern  . Not on file   Social History Narrative     Physical Exam: BP 124/68 mmHg  Pulse 73  Temp(Src) 97.8 F (36.6 C) (Tympanic)  Resp 17  Ht 6\' 2"  (1.88 m)  Wt 99.791 kg (220 lb)  BMI 28.23 kg/m2  SpO2 100% General:   Alert,  pleasant and cooperative  in NAD Head:  Normocephalic and atraumatic. Neck:  Supple; no masses or thyromegaly. Lungs:  Clear throughout to auscultation.    Heart:  Regular rate and rhythm. Abdomen:  Soft, nontender and nondistended. Normal bowel sounds, without guarding, and without rebound.   Neurologic:  Alert and  oriented x4;  grossly normal neurologically.  Impression/Plan: Brian Macdonald is here for an colonoscopy to be performed for screening  Risks, benefits, limitations, and alternatives regarding  colonoscopy have been reviewed with the patient.  Questions have been answered.  All parties agreeable.   Josefine Class, MD  09/11/2015, 8:13 AM

## 2015-09-11 NOTE — Anesthesia Postprocedure Evaluation (Signed)
  Anesthesia Post-op Note  Patient: Brian Macdonald  Procedure(s) Performed: Procedure(s): COLONOSCOPY WITH PROPOFOL (N/A)  Anesthesia type:General  Patient location: PACU  Post pain: Pain level controlled  Post assessment: Post-op Vital signs reviewed, Patient's Cardiovascular Status Stable, Respiratory Function Stable, Patent Airway and No signs of Nausea or vomiting  Post vital signs: Reviewed and stable  Last Vitals:  Filed Vitals:   09/11/15 0920  BP: 119/64  Pulse: 66  Temp:   Resp: 19    Level of consciousness: awake, alert  and patient cooperative  Complications: No apparent anesthesia complications

## 2015-09-11 NOTE — Transfer of Care (Signed)
Immediate Anesthesia Transfer of Care Note  Patient: Brian Macdonald  Procedure(s) Performed: Procedure(s): COLONOSCOPY WITH PROPOFOL (N/A)  Patient Location: PACU and Endoscopy Unit  Anesthesia Type:General  Level of Consciousness: sedated  Airway & Oxygen Therapy: Patient Spontanous Breathing and Patient connected to face mask oxygen  Post-op Assessment: Report given to RN and Post -op Vital signs reviewed and stable  Post vital signs: Reviewed and stable  Last Vitals:  Filed Vitals:   09/11/15 0851  BP: 84/47  Pulse: 67  Temp: 36.8 C  Resp: 18    Complications: No apparent anesthesia complications

## 2015-09-11 NOTE — Op Note (Signed)
[facility] Patient Name: Brian Macdonald Procedure Date: 09/11/2015 8:14 AM MRN: [id number] Account #: [id number] Date of Birth: 02/04/1951 Admit Type: Outpatient Age: 64 Room: [facility] ENDO ROOM 1 Gender: Male Note Status: Finalized Procedure:         Colonoscopy Indications:       Colon cancer screening in patient at increased risk:                     Family history of colon polyps(brother), Last colonoscopy                     10 years ago Patient Profile:   This is a 64 year old male. Providers:         Gerrit Heck. Rayann Heman, MD Referring MD:      Janine Ores. Rosanna Randy, MD (Referring MD) Medicines:         Propofol per Anesthesia Complications:     No immediate complications. Procedure:         Pre-Anesthesia Assessment:                    - Prior to the procedure, a History and Physical was                     performed, and patient medications, allergies and                     sensitivities were reviewed. The patient's tolerance of                     previous anesthesia was reviewed.                    - Prior to the procedure, a History and Physical was                     performed, and patient medications, allergies and                     sensitivities were reviewed. The patient's tolerance of                     previous anesthesia was reviewed.                    After obtaining informed consent, the colonoscope was                     passed under direct vision. Throughout the procedure, the                     patient's blood pressure, pulse, and oxygen saturations                     were monitored continuously. The Colonoscope was                     introduced through the anus and advanced to the the cecum,                     identified by appendiceal orifice and ileocecal valve. The                     colonoscopy was performed without difficulty. The patient  tolerated the procedure well. The quality of the bowel                      preparation was excellent. Findings:      The perianal exam findings include non-thrombosed external hemorrhoids.      Three sessile polyps were found in the transverse colon. The polyps were       4 to 6 mm in size. These polyps were removed with a cold snare.       Resection and retrieval were complete.      A few small-mouthed diverticula were found in the sigmoid colon.      Internal hemorrhoids were found during retroflexion. The hemorrhoids       were Grade I (internal hemorrhoids that do not prolapse).      The exam was otherwise without abnormality. Impression:        - Non-thrombosed external hemorrhoids found on perianal                     exam.                    - Three 4 to 6 mm polyps in the transverse colon. Resected                     and retrieved.                    - Diverticulosis in the sigmoid colon.                    - Internal hemorrhoids.                    - The examination was otherwise normal. Recommendation:    - Observe patient in GI recovery unit.                    - High fiber diet.                    - Continue present medications.                    - Await pathology results.                    - Repeat colonoscopy for surveillance based on pathology                     results.                    - Return to referring physician.                    - The findings and recommendations were discussed with the                     patient.                    - The findings and recommendations were discussed with the                     patient's family. Procedure Code(s): --- Professional ---                    [phone], Colonoscopy, flexible; with removal of tumor(s),  polyp(s), or other lesion(s) by snare technique CPT copyright 2014 American Medical Association. All rights reserved. The codes documented in this report are preliminary and upon coder review may  be revised to meet current compliance  requirements. Mellody Life, MD 09/11/2015 8:48:40 AM This report has been signed electronically. Number of Addenda: 0 Note Initiated On: 09/11/2015 8:14 AM Scope Withdrawal Time: 0 hours 16 minutes 14 seconds  Total Procedure Duration: 0 hours 22 minutes 13 seconds       [facility]

## 2015-09-11 NOTE — Anesthesia Preprocedure Evaluation (Addendum)
Anesthesia Evaluation  Patient identified by MRN, date of birth, ID band Patient awake    Reviewed: Allergy & Precautions, NPO status , Patient's Chart, lab work & pertinent test results, reviewed documented beta blocker date and time   History of Anesthesia Complications Negative for: history of anesthetic complications  Airway Mallampati: II       Dental  (+) Teeth Intact   Pulmonary neg pulmonary ROS, asthma , COPD (on predenisone),           Cardiovascular hypertension, Pt. on medications and Pt. on home beta blockers      Neuro/Psych    GI/Hepatic Neg liver ROS, GERD  Medicated,  Endo/Other  diabetes, Type 2, Oral Hypoglycemic Agents  Renal/GU negative Renal ROS     Musculoskeletal   Abdominal   Peds  Hematology negative hematology ROS (+)   Anesthesia Other Findings   Reproductive/Obstetrics                            Anesthesia Physical Anesthesia Plan  ASA: III  Anesthesia Plan: General   Post-op Pain Management:    Induction: Intravenous  Airway Management Planned: Nasal Cannula  Additional Equipment:   Intra-op Plan:   Post-operative Plan:   Informed Consent: I have reviewed the patients History and Physical, chart, labs and discussed the procedure including the risks, benefits and alternatives for the proposed anesthesia with the patient or authorized representative who has indicated his/her understanding and acceptance.     Plan Discussed with:   Anesthesia Plan Comments:         Anesthesia Quick Evaluation

## 2015-09-14 LAB — SURGICAL PATHOLOGY

## 2015-09-16 ENCOUNTER — Encounter: Payer: Self-pay | Admitting: Gastroenterology

## 2015-10-01 ENCOUNTER — Other Ambulatory Visit: Payer: Self-pay | Admitting: Family Medicine

## 2015-11-11 IMAGING — US ABDOMEN ULTRASOUND
1 series · 13 of 25 positions shown · non-contrast
Comparison: None.

CLINICAL DATA: Right upper quadrant bruising and tenderness for 1
week ; no known trauma

EXAM:
ULTRASOUND ABDOMEN COMPLETE

[Series 1: abdomen ultrasound · 0.23mm/px · 13 of 101 slices shown]
[im 1/101]
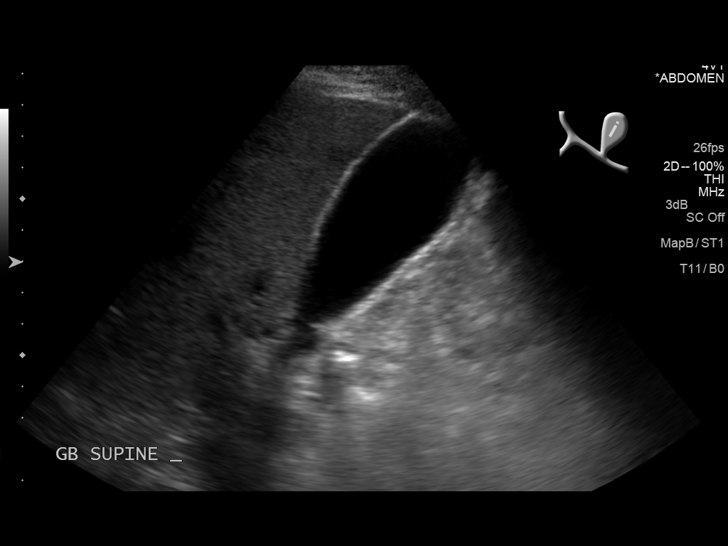
[im 9/101]
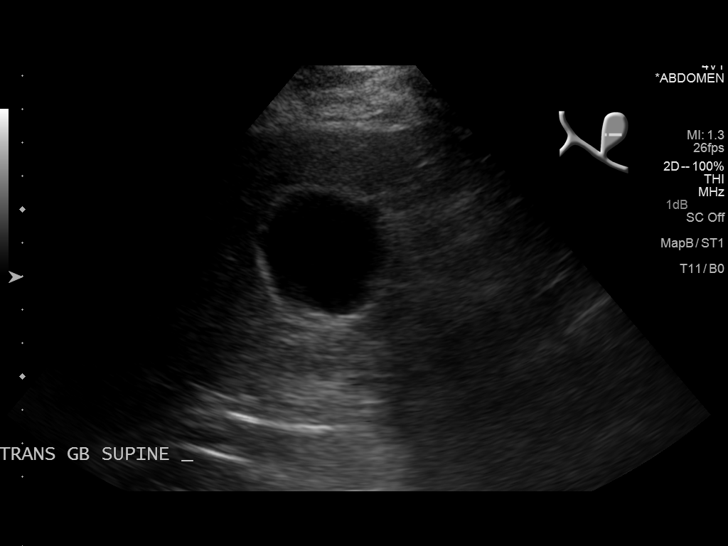
[im 17/101]
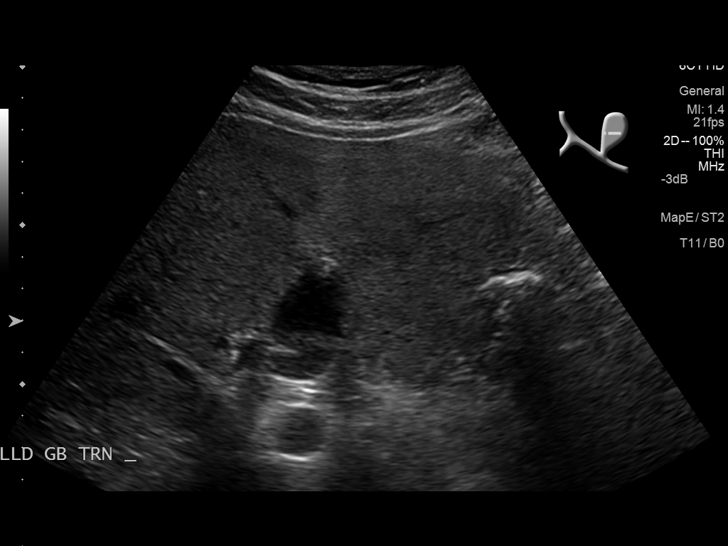
[im 26/101]
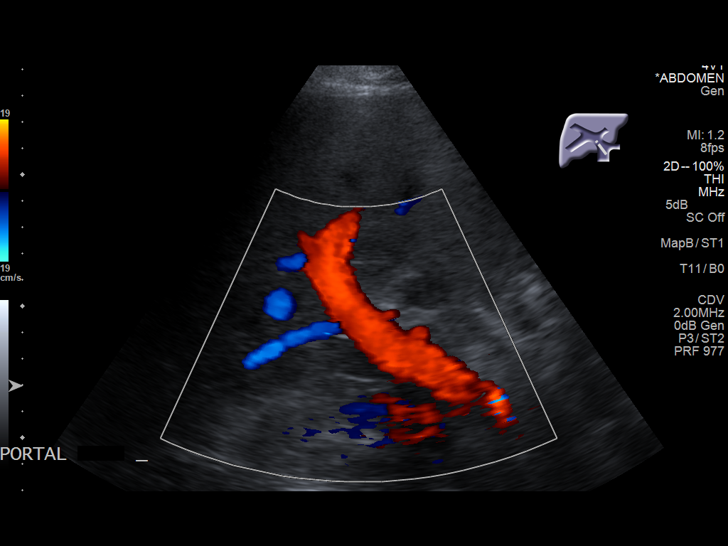
[im 34/101]
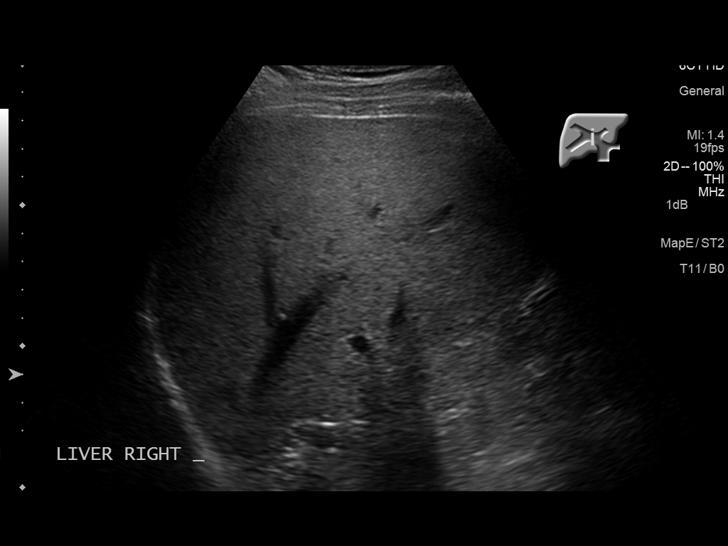
[im 42/101]
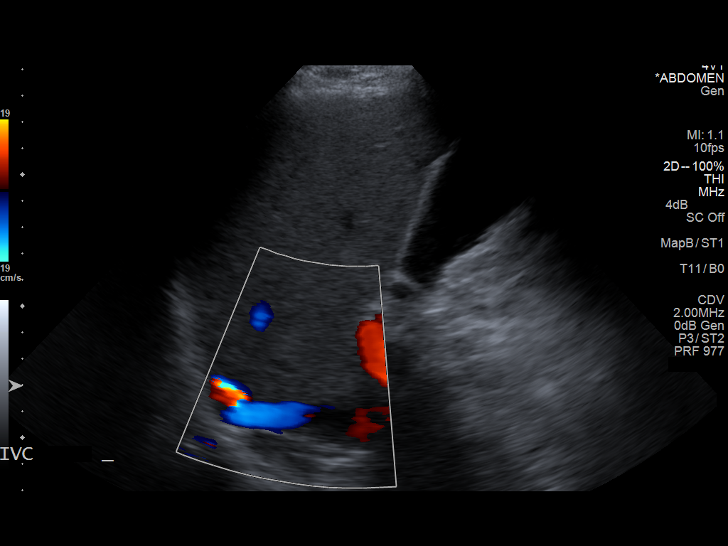
[im 51/101]
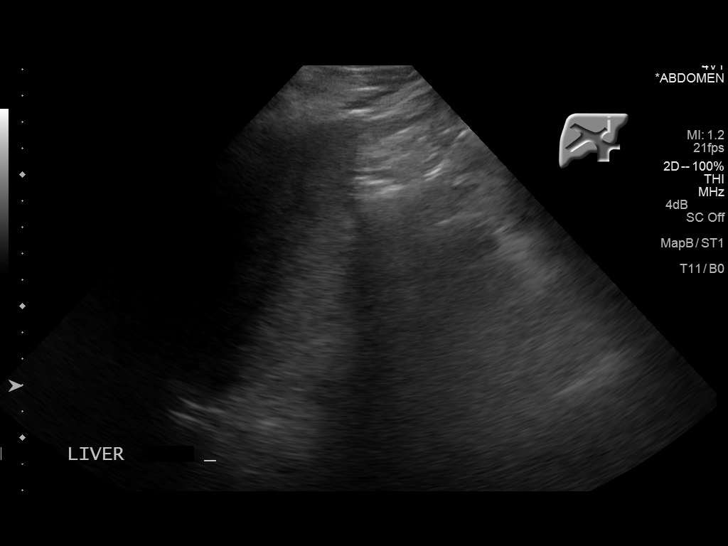
[im 59/101]
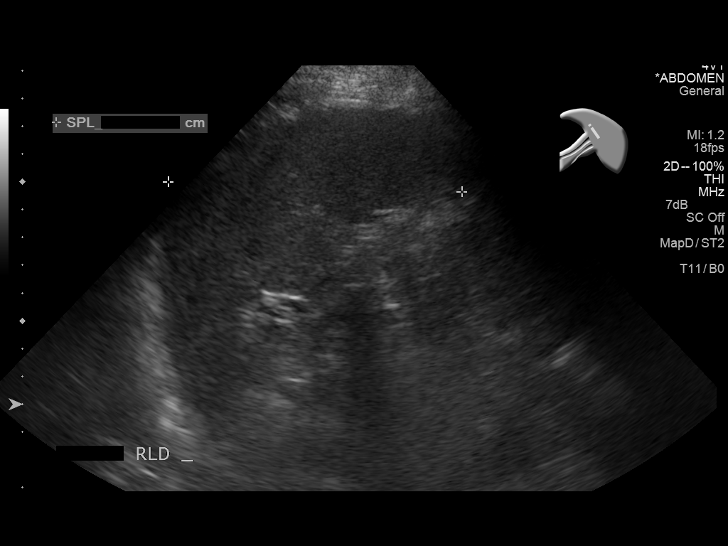
[im 67/101]
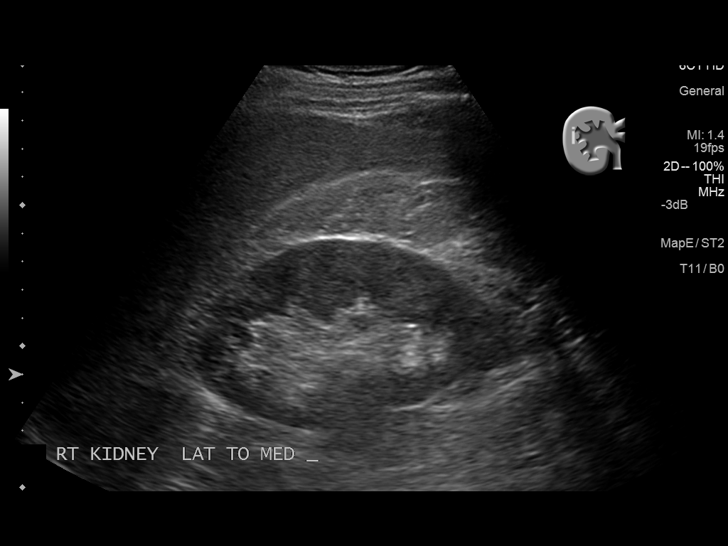
[im 76/101]
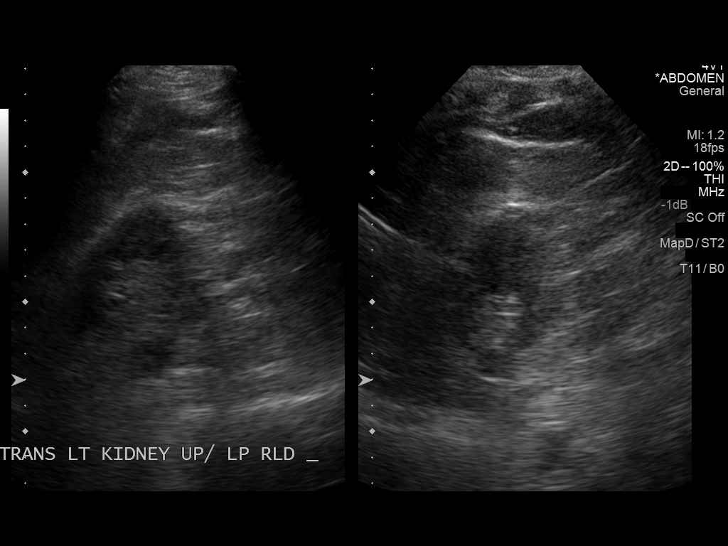
[im 84/101]
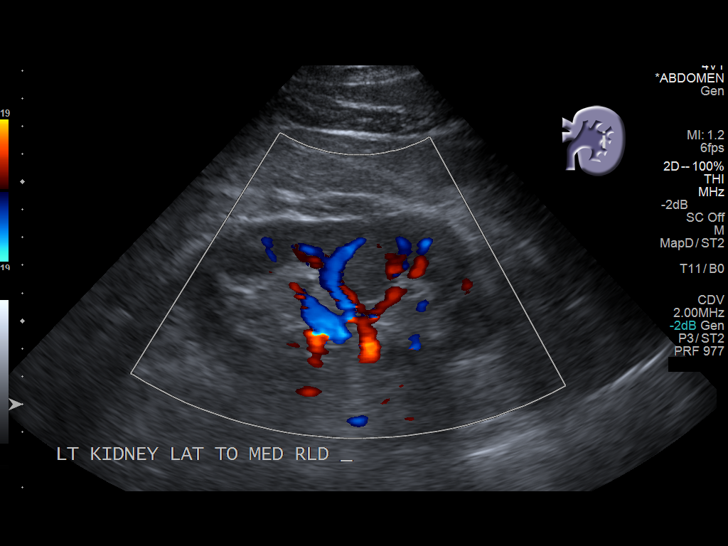
[im 92/101]
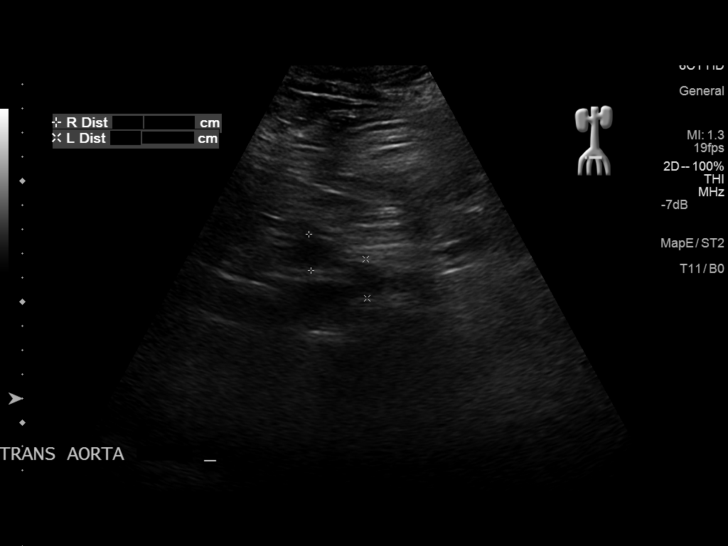
[im 101/101]
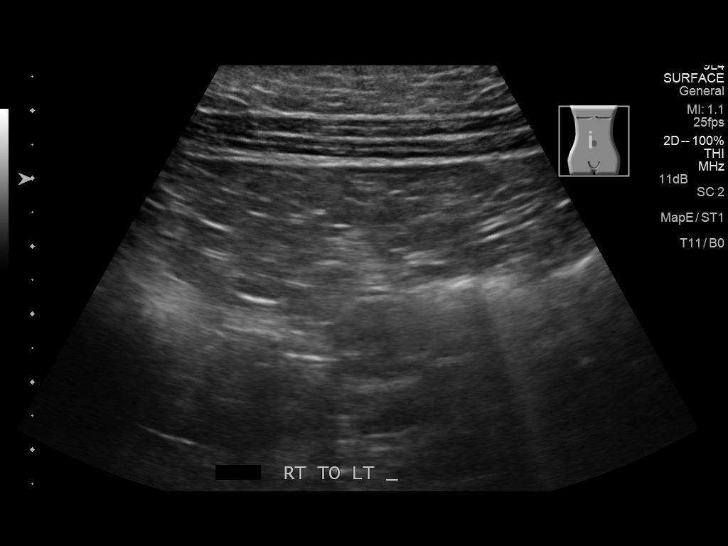

[13 of 25 positions shown; findings below may reference images not displayed]

FINDINGS: Gallbladder: No gallstones or wall thickening visualized. No
sonographic Murphy sign noted.

Common bile duct: Diameter: 3.5 mm

Liver: The hepatic echotexture is mildly increased. There is no
focal mass or ductal dilation.

IVC: No abnormality visualized.

Pancreas: The pancreas was obscured by bowel gas.

Spleen: Size and appearance within normal limits.

Right Kidney: Length: 12.7 cm. Echogenicity within normal limits. No
mass or hydronephrosis visualized.

Left Kidney: Length: 13.3 cm. Echogenicity within normal limits. No
mass or hydronephrosis visualized.

Abdominal aorta: No aneurysm visualized.

Other findings: No ascites is demonstrated. Interrogation of the
subcutaneous tissues of the right upper quadrant reveals no cystic
or solid mass in the region of bruising to the right of midline. No
abnormal vascularity is demonstrated.
IMPRESSION: 1. There is no evidence of acute gallbladder pathology.
2. Increased hepatic echotexture is consistent with fatty
infiltrative change. The pancreas was obscured by bowel gas.
3. No abdominal wall bruising or hematoma is demonstrated.
Further evaluation of the abdomen with CT scanning may be useful
given the unexplained right upper quadrant bruising.

## 2015-11-16 ENCOUNTER — Ambulatory Visit: Payer: 59 | Admitting: Family Medicine

## 2015-11-19 ENCOUNTER — Other Ambulatory Visit: Payer: Self-pay | Admitting: Family Medicine

## 2015-12-18 ENCOUNTER — Other Ambulatory Visit: Payer: Self-pay | Admitting: Family Medicine

## 2016-01-06 ENCOUNTER — Ambulatory Visit: Payer: 59 | Admitting: Family Medicine

## 2016-01-14 ENCOUNTER — Ambulatory Visit (INDEPENDENT_AMBULATORY_CARE_PROVIDER_SITE_OTHER): Payer: 59 | Admitting: Family Medicine

## 2016-01-14 ENCOUNTER — Encounter: Payer: Self-pay | Admitting: Family Medicine

## 2016-01-14 VITALS — BP 116/64 | HR 68 | Temp 97.9°F | Resp 16 | Wt 238.0 lb

## 2016-01-14 DIAGNOSIS — J45909 Unspecified asthma, uncomplicated: Secondary | ICD-10-CM

## 2016-01-14 DIAGNOSIS — N400 Enlarged prostate without lower urinary tract symptoms: Secondary | ICD-10-CM | POA: Diagnosis not present

## 2016-01-14 DIAGNOSIS — I10 Essential (primary) hypertension: Secondary | ICD-10-CM | POA: Diagnosis not present

## 2016-01-14 DIAGNOSIS — E1169 Type 2 diabetes mellitus with other specified complication: Secondary | ICD-10-CM | POA: Diagnosis not present

## 2016-01-14 DIAGNOSIS — K219 Gastro-esophageal reflux disease without esophagitis: Secondary | ICD-10-CM | POA: Diagnosis not present

## 2016-01-14 DIAGNOSIS — K439 Ventral hernia without obstruction or gangrene: Secondary | ICD-10-CM | POA: Diagnosis not present

## 2016-01-14 DIAGNOSIS — E291 Testicular hypofunction: Secondary | ICD-10-CM

## 2016-01-14 DIAGNOSIS — E785 Hyperlipidemia, unspecified: Secondary | ICD-10-CM

## 2016-01-14 LAB — POCT GLYCOSYLATED HEMOGLOBIN (HGB A1C): HEMOGLOBIN A1C: 6.5

## 2016-01-14 NOTE — Patient Instructions (Signed)
Stop simvastatin for 1 month and follow up. For the Am cough and mucus take over the counter Robitussin at night.

## 2016-01-14 NOTE — Progress Notes (Signed)
Patient ID: Brian Macdonald, male   DOB: [birth year], 65 y.o.   MRN: [id number]    Subjective:  HPI  Diabetes Mellitus Type II, Follow-up:   Lab Results  Component Value Date   HGBA1C 6.7* 07/09/2015   HGBA1C 6.5* 02/04/2015   Last seen for diabetes 4 months ago.  Management since then includes none. He reports good compliance with treatment. He is not having side effects.  Current symptoms include none Home blood sugar records: not being checked  Episodes of hypoglycemia? no   Current Insulin Regimen: n/aw Most Recent Eye Exam: 08/2015 Weight trend: stable Current exercise: walking and golf  ------------------------------------------------------------------------   Hypertension, follow-up:  BP Readings from Last 3 Encounters:  01/14/16 116/64  09/11/15 119/64  09/02/15 114/58    He was last seen for hypertension 4 months ago.  BP at that visit was 114/58. Management since that visit includes none .He reports good compliance with treatment. He is not having side effects.  He is exercising. He is adherent to low salt diet.   Outside blood pressures are not being checked unless he is feeling bad. He is experiencing none.  Patient denies chest pain, chest pressure/discomfort, claudication, dyspnea, exertional chest pressure/discomfort, fatigue, lower extremity edema and palpitations.   Cardiovascular risk factors include advanced age (older than 63 for men, 37 for women), diabetes mellitus, dyslipidemia, hypertension and male gender.  ------------------------------------------------------------------------    Lipid/Cholesterol, Follow-up:   Last seen for this 4 months ago.  Management since that visit includes none.  Last Lipid Panel:    Component Value Date/Time   CHOL 131 07/09/2015 1029   CHOL 129 06/19/2014   TRIG 188* 07/09/2015 1029   HDL 30* 07/09/2015 1029   HDL 34* 06/19/2014   LDLCALC 63 07/09/2015 1029   LDLCALC 61 06/19/2014    He reports  good compliance with treatment. He is not having side effects.   Wt Readings from Last 3 Encounters:  01/14/16 238 lb (107.956 kg)  09/11/15 220 lb (99.791 kg)  09/02/15 231 lb (104.781 kg)    ------------------------------------------------------------------------ Pt also wants to ask about his tamsulosin because of insurance and cost purposes. Insurance said terazosin was cheaper and was in the same class.  He also has devolved eczema behind his ears and reports that this could be a side effect from the simvastatin and his dermatologist gave him something ans does not seem to be helping. He also has been having muscle aches, no severe but he can tell his muscles are sore.     Prior to Admission medications   Medication Sig Start Date End Date Taking? Authorizing Provider  amLODipine (NORVASC) 10 MG tablet Take by mouth. 03/19/15  Yes Historical Provider, MD  Aspirin 81 MG EC tablet Take by mouth. Reported on 01/14/2016 12/08/10  Yes Historical Provider, MD  budesonide (PULMICORT) 0.5 MG/2ML nebulizer solution Inhale into the lungs. 10/05/15 10/04/16 Yes Historical Provider, MD  carvedilol (COREG) 25 MG tablet Take by mouth. 04/02/15  Yes Historical Provider, MD  fluticasone (FLONASE) 50 MCG/ACT nasal spray Place into the nose.   Yes Historical Provider, MD  hydrochlorothiazide (HYDRODIURIL) 25 MG tablet Take 1 tablet by mouth  daily 11/19/15  Yes Richard Maceo Pro., MD  losartan (COZAAR) 100 MG tablet Take 1 tablet (100 mg total) by mouth daily. 07/27/15  Yes Richard Maceo Pro., MD  metFORMIN (GLUCOPHAGE) 1000 MG tablet Take 1 tablet by mouth two  times daily 10/02/15  Yes Richard Maceo Pro., MD  montelukast (SINGULAIR) 10 MG tablet Take by mouth.   Yes Historical Provider, MD  MULTIPLE VITAMIN PO Take by mouth. 12/08/10  Yes Historical Provider, MD  Omeprazole 20 MG TBEC Take by mouth. 02/18/15  Yes Historical Provider, MD  sildenafil (VIAGRA) 100 MG tablet Take by mouth. 06/18/14  Yes  Historical Provider, MD  simvastatin (ZOCOR) 20 MG tablet Take 1 tablet by mouth  daily 12/18/15  Yes Richard Maceo Pro., MD  tamsulosin [facility]) 0.4 MG CAPS capsule Take by mouth. 02/18/15  Yes Historical Provider, MD  Testosterone (AXIRON) 30 MG/ACT SOLN Place onto the skin.   Yes Historical Provider, MD    Patient Active Problem List   Diagnosis Date Noted  . Asthma with allergic rhinitis 06/06/2015  . At risk for osteoporosis 06/06/2015  . Benign prostatic hypertrophy without urinary obstruction 06/06/2015  . Diabetes mellitus, type 2 ([place]) 06/06/2015  . Essential (primary) hypertension 06/06/2015  . Gastro-esophageal reflux disease without esophagitis 06/06/2015  . HLD (hyperlipidemia) 06/06/2015  . Failure of erection 06/06/2015  . Snores 06/06/2015  . Testicular hypofunction 06/06/2015  . Essential hypertension 11/02/2007  . SINUSITIS CHRONIC, UNSPECIFIED 11/02/2007  . ALLERGIC RHINITIS 11/02/2007    Past Medical History  Diagnosis Date  . Multiple allergies   . Diabetes mellitus without complication ([place])   . GERD (gastroesophageal reflux disease)   . Hypertension   . Restless leg syndrome   . Asthma   . Lipids blood increased   . Rhinitis, allergic   . COPD (chronic obstructive pulmonary disease) ([place])   . Cancer [facility])     Social History   Social History  . Marital Status: Married    Spouse Name: N/A  . Number of Children: N/A  . Years of Education: N/A   Occupational History  . Not on file.   Social History Main Topics  . Smoking status: Never Smoker   . Smokeless tobacco: Never Used  . Alcohol Use: Yes     Comment: "about every day" " Doctor told me to have 2 drinks a night"  . Drug Use: No  . Sexual Activity: Not on file   Other Topics Concern  . Not on file   Social History Narrative    No Known Allergies  Review of Systems  Constitutional: Negative.   Eyes: Negative.   Respiratory: Negative.   Cardiovascular: Negative.     Gastrointestinal: Negative.   Genitourinary: Negative.   Musculoskeletal: Positive for myalgias.  Skin: Positive for rash.  Neurological: Negative.   Endo/Heme/Allergies: Negative.   Psychiatric/Behavioral: Negative.     Immunization History  Administered Date(s) Administered  . Pneumococcal Conjugate-13 02/04/2015  . Pneumococcal Polysaccharide-23 07/25/2011  . Td 04/15/2004  . Tdap 07/25/2011  . Zoster 10/12/2011   Objective:  BP 116/64 mmHg  Pulse 68  Temp(Src) 97.9 F (36.6 C) (Oral)  Resp 16  Wt 238 lb (107.956 kg)  Physical Exam  Constitutional: He is oriented to person, place, and time and well-developed, well-nourished, and in no distress.  HENT:  Head: Normocephalic and atraumatic.  Right Ear: External ear normal.  Left Ear: External ear normal.  Nose: Nose normal.  Eyes: Conjunctivae and EOM are normal. Pupils are equal, round, and reactive to light.  Neck: Normal range of motion. Neck supple.  Cardiovascular: Normal rate, regular rhythm, normal heart sounds and intact distal pulses.   Pulmonary/Chest: Effort normal and breath sounds normal.  Abdominal: Soft. Bowel sounds are normal.  Small ventral hernia  Musculoskeletal: Normal range of  motion.  Neurological: He is alert and oriented to person, place, and time. He has normal reflexes. Gait normal. GCS score is 15.  Skin: Skin is warm and dry.  Fair skin. Midface redness consistent with rosacea  Psychiatric: Mood, memory, affect and judgment normal.    Lab Results  Component Value Date   WBC 8.7 07/09/2015   HGB 15.5 06/19/2014   HCT 44.6 07/09/2015   PLT 245 07/09/2015   GLUCOSE 136* 07/09/2015   CHOL 131 07/09/2015   TRIG 188* 07/09/2015   HDL 30* 07/09/2015   LDLCALC 63 07/09/2015   TSH 1.720 07/09/2015   HGBA1C 6.7* 07/09/2015    CMP     Component Value Date/Time   NA 139 07/09/2015 1029   K 4.6 07/09/2015 1029   CL 96* 07/09/2015 1029   CO2 30* 07/09/2015 1029   GLUCOSE 136*  07/09/2015 1029   BUN 13 07/09/2015 1029   CREATININE 1.12 07/09/2015 1029   CREATININE 1.1 06/19/2014   CALCIUM 9.4 07/09/2015 1029   PROT 6.7 07/09/2015 1029   ALBUMIN 4.6 07/09/2015 1029   AST 26 07/09/2015 1029   ALT 26 07/09/2015 1029   ALKPHOS 88 07/09/2015 1029   BILITOT 0.7 07/09/2015 1029   GFRNONAA 69 07/09/2015 1029   GFRAA 79 07/09/2015 1029    Assessment and Plan :   1. Type 2 diabetes mellitus with other specified complication (HCC)  - POCT HgB A1C-6.5 today. Stable.   2. Essential hypertension Stable.   3. HLD (hyperlipidemia) Stop simvastatin due to muscle pain for a month.   4. Asthma with allergic rhinitis, unspecified asthma severity, uncomplicated Followed by Dr. Raul Del.  Now  off of prednisone after many years of steroid use, treated by Dr. Raul Del 5. GERD- stable.   6. Testicular hypofunction Testosterone 530 with Dr. Rogers Blocker   7. Benign prostatic hypertrophy without urinary obstruction PSA 1.3 with Dr. Rogers Blocker  8. Ventral hernia- small.  Patient was seen and examined by Dr. Miguel Aschoff, and noted scribed by Webb Laws, [place] MD [facility] 01/14/2016 3:52 PM

## 2016-02-18 ENCOUNTER — Ambulatory Visit: Payer: 59 | Admitting: Family Medicine

## 2016-02-24 ENCOUNTER — Encounter: Payer: Self-pay | Admitting: Family Medicine

## 2016-02-24 ENCOUNTER — Ambulatory Visit (INDEPENDENT_AMBULATORY_CARE_PROVIDER_SITE_OTHER): Payer: 59 | Admitting: Family Medicine

## 2016-02-24 VITALS — BP 128/62 | HR 72 | Temp 98.2°F | Resp 16 | Wt 235.0 lb

## 2016-02-24 DIAGNOSIS — E785 Hyperlipidemia, unspecified: Secondary | ICD-10-CM | POA: Diagnosis not present

## 2016-02-24 DIAGNOSIS — M791 Myalgia, unspecified site: Secondary | ICD-10-CM

## 2016-02-24 NOTE — Progress Notes (Signed)
Patient ID: Brian Macdonald, male   DOB: November 01, 1951, 65 y.o.   MRN: [id number]    Subjective:  HPI Pt is here for a 1 month follow up for myalgias. He was taking simvastatin. Last OV he was taken off of medication and reports that he feels much better off of simvastatin. He is not having myalgias.  Prior to Admission medications   Medication Sig Start Date End Date Taking? Authorizing Provider  amLODipine (NORVASC) 10 MG tablet Take by mouth. 03/19/15  Yes Historical Provider, MD  Aspirin 81 MG EC tablet Take by mouth. Reported on 01/14/2016 12/08/10  Yes Historical Provider, MD  budesonide (PULMICORT) 0.5 MG/2ML nebulizer solution Inhale into the lungs. 10/05/15 10/04/16 Yes Historical Provider, MD  carvedilol (COREG) 25 MG tablet Take by mouth. 04/02/15  Yes Historical Provider, MD  fluticasone (FLONASE) 50 MCG/ACT nasal spray Place into the nose.   Yes Historical Provider, MD  hydrochlorothiazide (HYDRODIURIL) 25 MG tablet Take 1 tablet by mouth  daily 11/19/15  Yes Richard Maceo Pro., MD  losartan (COZAAR) 100 MG tablet Take 1 tablet (100 mg total) by mouth daily. 07/27/15  Yes Richard Maceo Pro., MD  metFORMIN (GLUCOPHAGE) 1000 MG tablet Take 1 tablet by mouth two  times daily 10/02/15  Yes Richard Maceo Pro., MD  montelukast (SINGULAIR) 10 MG tablet Take by mouth.   Yes Historical Provider, MD  MULTIPLE VITAMIN PO Take by mouth. 12/08/10  Yes Historical Provider, MD  Omeprazole 20 MG TBEC Take by mouth. 02/18/15  Yes Historical Provider, MD  sildenafil (VIAGRA) 100 MG tablet Take by mouth. 06/18/14  Yes Historical Provider, MD  simvastatin (ZOCOR) 20 MG tablet Take 1 tablet by mouth  daily 12/18/15  Yes Richard Maceo Pro., MD  tamsulosin [facility]) 0.4 MG CAPS capsule Take by mouth. 02/18/15  Yes Historical Provider, MD  Testosterone (AXIRON) 30 MG/ACT SOLN Place onto the skin.   Yes Historical Provider, MD    Patient Active Problem List   Diagnosis Date Noted  . Ventral hernia  01/14/2016  . Asthma with allergic rhinitis 06/06/2015  . At risk for osteoporosis 06/06/2015  . Benign prostatic hypertrophy without urinary obstruction 06/06/2015  . Diabetes mellitus, type 2 ([place]) 06/06/2015  . Essential (primary) hypertension 06/06/2015  . Gastro-esophageal reflux disease without esophagitis 06/06/2015  . HLD (hyperlipidemia) 06/06/2015  . Failure of erection 06/06/2015  . Snores 06/06/2015  . Testicular hypofunction 06/06/2015  . Essential hypertension 11/02/2007  . SINUSITIS CHRONIC, UNSPECIFIED 11/02/2007  . ALLERGIC RHINITIS 11/02/2007    Past Medical History  Diagnosis Date  . Multiple allergies   . Diabetes mellitus without complication ([place])   . GERD (gastroesophageal reflux disease)   . Hypertension   . Restless leg syndrome   . Asthma   . Lipids blood increased   . Rhinitis, allergic   . COPD (chronic obstructive pulmonary disease) ([place])   . Cancer [facility])     Social History   Social History  . Marital Status: Married    Spouse Name: N/A  . Number of Children: N/A  . Years of Education: N/A   Occupational History  . Not on file.   Social History Main Topics  . Smoking status: Never Smoker   . Smokeless tobacco: Never Used  . Alcohol Use: Yes     Comment: "about every day" " Doctor told me to have 2 drinks a night"  . Drug Use: No  . Sexual Activity: Not on file   Other  Topics Concern  . Not on file   Social History Narrative    No Known Allergies  Review of Systems  Constitutional: Negative.   HENT: Negative.   Eyes: Negative.   Respiratory: Negative.   Cardiovascular: Negative.   Gastrointestinal: Negative.   Genitourinary: Negative.   Musculoskeletal: Negative.   Skin: Negative.   Neurological: Negative.   Endo/Heme/Allergies: Negative.   Psychiatric/Behavioral: Negative.     Immunization History  Administered Date(s) Administered  . Pneumococcal Conjugate-13 02/04/2015  . Pneumococcal Polysaccharide-23  07/25/2011  . Td 04/15/2004  . Tdap 07/25/2011  . Zoster 10/12/2011   Objective:  BP 128/62 mmHg  Pulse 72  Temp(Src) 98.2 F (36.8 C) (Oral)  Resp 16  Wt 235 lb (106.595 kg)  Physical Exam  Constitutional: He is oriented to person, place, and time and well-developed, well-nourished, and in no distress.  Eyes: Conjunctivae and EOM are normal. Pupils are equal, round, and reactive to light.  Neck: Normal range of motion. Neck supple.  Cardiovascular: Normal rate, regular rhythm, normal heart sounds and intact distal pulses.   Pulmonary/Chest: Effort normal and breath sounds normal.  Musculoskeletal: Normal range of motion.  Neurological: He is alert and oriented to person, place, and time. He has normal reflexes. Gait normal. GCS score is 15.  Skin: Skin is warm and dry.  Psychiatric: Memory, affect and judgment normal.    Lab Results  Component Value Date   WBC 8.7 07/09/2015   HGB 15.5 06/19/2014   HCT 44.6 07/09/2015   PLT 245 07/09/2015   GLUCOSE 136* 07/09/2015   CHOL 131 07/09/2015   TRIG 188* 07/09/2015   HDL 30* 07/09/2015   LDLCALC 63 07/09/2015   TSH 1.720 07/09/2015   HGBA1C 6.5 01/14/2016    CMP     Component Value Date/Time   NA 139 07/09/2015 1029   K 4.6 07/09/2015 1029   CL 96* 07/09/2015 1029   CO2 30* 07/09/2015 1029   GLUCOSE 136* 07/09/2015 1029   BUN 13 07/09/2015 1029   CREATININE 1.12 07/09/2015 1029   CREATININE 1.1 06/19/2014   CALCIUM 9.4 07/09/2015 1029   PROT 6.7 07/09/2015 1029   ALBUMIN 4.6 07/09/2015 1029   AST 26 07/09/2015 1029   ALT 26 07/09/2015 1029   ALKPHOS 88 07/09/2015 1029   BILITOT 0.7 07/09/2015 1029   GFRNONAA 69 07/09/2015 1029   GFRAA 79 07/09/2015 1029    Assessment and Plan :  1. HLD (hyperlipidemia)  - Comprehensive metabolic panel - Lipid Panel With LDL/HDL Ratio  2. Myalgia Better off of simvastatin.  - CK  I have done the exam and reviewed the above chart and it is accurate to the best of my  knowledge.  Patient was seen and examined by Dr. Miguel Aschoff, and noted scribed by Webb Laws, [place] MD [facility] 02/24/2016 4:24 PM

## 2016-03-07 ENCOUNTER — Other Ambulatory Visit: Payer: Self-pay | Admitting: Family Medicine

## 2016-03-08 LAB — LIPID PANEL WITH LDL/HDL RATIO
Cholesterol, Total: 172 mg/dL (ref 100–199)
HDL: 32 mg/dL — ABNORMAL LOW (ref >39)
LDL Calculated: 102 mg/dL — ABNORMAL HIGH (ref 0–99)
LDL/HDL RATIO: 3.2 ratio (ref 0.0–3.6)
TRIGLYCERIDES: 192 mg/dL — AB (ref 0–149)
VLDL Cholesterol Cal: 38 mg/dL (ref 5–40)

## 2016-03-08 LAB — COMPREHENSIVE METABOLIC PANEL
A/G RATIO: 1.8 (ref 1.2–2.2)
ALBUMIN: 4.4 g/dL (ref 3.6–4.8)
ALK PHOS: 102 IU/L (ref 39–117)
ALT: 26 IU/L (ref 0–44)
AST: 29 IU/L (ref 0–40)
BUN/Creatinine Ratio: 11 (ref 10–24)
BUN: 11 mg/dL (ref 8–27)
Bilirubin Total: 0.5 mg/dL (ref 0.0–1.2)
CALCIUM: 10 mg/dL (ref 8.6–10.2)
CO2: 26 mmol/L (ref 18–29)
Chloride: 94 mmol/L — ABNORMAL LOW (ref 96–106)
Creatinine, Ser: 0.98 mg/dL (ref 0.76–1.27)
GFR calc Af Amer: 92 mL/min/{1.73_m2} (ref >59)
GFR, EST NON AFRICAN AMERICAN: 80 mL/min/{1.73_m2} (ref >59)
GLOBULIN, TOTAL: 2.5 g/dL (ref 1.5–4.5)
Glucose: 112 mg/dL — ABNORMAL HIGH (ref 65–99)
POTASSIUM: 4.7 mmol/L (ref 3.5–5.2)
SODIUM: 136 mmol/L (ref 134–144)
TOTAL PROTEIN: 6.9 g/dL (ref 6.0–8.5)

## 2016-03-08 LAB — CK: Total CK: 124 U/L (ref 24–204)

## 2016-03-15 ENCOUNTER — Telehealth: Payer: Self-pay

## 2016-03-15 NOTE — Telephone Encounter (Signed)
-----   Message from Jerrol Banana., MD sent at 03/15/2016  8:39 AM EDT ----- Also consider Zetia.

## 2016-03-15 NOTE — Telephone Encounter (Signed)
Advised patient as below. Patient reports that he would like to work on diet and exercise.     Notes Recorded by Jerrol Banana., MD on 03/15/2016 at 8:38 AM Cholesterol mildly elevated. Work on low-fat, low-cholesterol diet

## 2016-03-23 ENCOUNTER — Ambulatory Visit: Payer: 59 | Admitting: Family Medicine

## 2016-03-24 ENCOUNTER — Other Ambulatory Visit: Payer: Self-pay | Admitting: Family Medicine

## 2016-03-31 ENCOUNTER — Ambulatory Visit (INDEPENDENT_AMBULATORY_CARE_PROVIDER_SITE_OTHER): Payer: 59 | Admitting: Family Medicine

## 2016-03-31 VITALS — BP 110/60 | HR 72 | Temp 98.7°F | Resp 16 | Wt 233.0 lb

## 2016-03-31 DIAGNOSIS — E785 Hyperlipidemia, unspecified: Secondary | ICD-10-CM

## 2016-03-31 MED ORDER — EZETIMIBE 10 MG PO TABS: 10.0000 mg | ORAL_TABLET | Freq: Every day | ORAL | Status: DC

## 2016-03-31 NOTE — Progress Notes (Signed)
Patient ID: Brian Macdonald, male   DOB: 13-Sep-1951, 65 y.o.   MRN: [id number]   Brian Macdonald  MRN: [id number] DOB: 1951-04-15  Subjective:  HPI   1. HLD (hyperlipidemia) The patient is a 65 year old male who presents for follow up after he was taken off of his Simvastatin due to myalgias.  The patient states he can definitely tell that the myalgias have improved off of the statin.  He is here today to discuss treatment options.  Patient Active Problem List   Diagnosis Date Noted  . Ventral hernia 01/14/2016  . Asthma with allergic rhinitis 06/06/2015  . At risk for osteoporosis 06/06/2015  . Benign prostatic hypertrophy without urinary obstruction 06/06/2015  . Diabetes mellitus, type 2 ([place]) 06/06/2015  . Essential (primary) hypertension 06/06/2015  . Gastro-esophageal reflux disease without esophagitis 06/06/2015  . HLD (hyperlipidemia) 06/06/2015  . Failure of erection 06/06/2015  . Snores 06/06/2015  . Testicular hypofunction 06/06/2015  . Essential hypertension 11/02/2007  . SINUSITIS CHRONIC, UNSPECIFIED 11/02/2007  . ALLERGIC RHINITIS 11/02/2007    Past Medical History  Diagnosis Date  . Multiple allergies   . Diabetes mellitus without complication ([place])   . GERD (gastroesophageal reflux disease)   . Hypertension   . Restless leg syndrome   . Asthma   . Lipids blood increased   . Rhinitis, allergic   . COPD (chronic obstructive pulmonary disease) ([place])   . Cancer [facility])     Social History   Social History  . Marital Status: Married    Spouse Name: N/A  . Number of Children: N/A  . Years of Education: N/A   Occupational History  . Not on file.   Social History Main Topics  . Smoking status: Never Smoker   . Smokeless tobacco: Never Used  . Alcohol Use: Yes     Comment: "about every day" " Doctor told me to have 2 drinks a night"  . Drug Use: No  . Sexual Activity: Not on file   Other Topics Concern  . Not on file   Social History Narrative     Outpatient Prescriptions Prior to Visit  Medication Sig Dispense Refill  . amLODipine (NORVASC) 10 MG tablet Take by mouth.    . Aspirin 81 MG EC tablet Take by mouth. Reported on 01/14/2016    . budesonide (PULMICORT) 0.5 MG/2ML nebulizer solution Inhale into the lungs.    . carvedilol (COREG) 25 MG tablet Take by mouth.    . fluticasone (FLONASE) 50 MCG/ACT nasal spray Place into the nose.    . hydrochlorothiazide (HYDRODIURIL) 25 MG tablet Take 1 tablet by mouth  daily 90 tablet 3  . losartan (COZAAR) 100 MG tablet Take 1 tablet (100 mg total) by mouth daily. 30 tablet 0  . metFORMIN (GLUCOPHAGE) 1000 MG tablet Take 1 tablet by mouth two  times daily 180 tablet 2  . montelukast (SINGULAIR) 10 MG tablet Take by mouth.    . MULTIPLE VITAMIN PO Take by mouth.    Marland Kitchen omeprazole (PRILOSEC) 20 MG capsule Take 1 capsule by mouth  daily 90 capsule 3  . sildenafil (VIAGRA) 100 MG tablet Take by mouth.    . tamsulosin (FLOMAX) 0.4 MG CAPS capsule Take 1 capsule by mouth  every day 90 capsule 3  . Testosterone (AXIRON) 30 MG/ACT SOLN Place onto the skin.    Marland Kitchen Omeprazole 20 MG TBEC Take by mouth.    . simvastatin (ZOCOR) 20 MG tablet Take 1 tablet by  mouth  daily 90 tablet 2   No facility-administered medications prior to visit.    No Known Allergies  Review of Systems  Constitutional: Negative for fever and malaise/fatigue.  Respiratory: Positive for wheezing. Negative for cough and shortness of breath.   Cardiovascular: Negative for chest pain, palpitations, orthopnea, claudication, leg swelling (None today but on occasion he gets some) and PND.  Neurological: Negative for dizziness, weakness and headaches.   Objective:  BP 110/60 mmHg  Pulse 72  Temp(Src) 98.7 F (37.1 C) (Oral)  Resp 16  Wt 233 lb (105.688 kg)  Physical Exam  Constitutional: He is oriented to person, place, and time and well-developed, well-nourished, and in no distress.  HENT:  Head: Normocephalic.  Eyes:  Pupils are equal, round, and reactive to light.  Neck: Normal range of motion.  Cardiovascular: Normal rate, regular rhythm and normal heart sounds.   Pulmonary/Chest: Effort normal and breath sounds normal.  Neurological: He is alert and oriented to person, place, and time. Gait normal.  Skin: Skin is warm and dry.  Psychiatric: Mood, memory, affect and judgment normal.    Assessment and Plan :   1. HLD (hyperlipidemia) Will start Zetia.  Patient to have his lipids in 3 weeks.  Will also check his A1C at that time.   - ezetimibe (ZETIA) 10 MG tablet; Take 1 tablet (10 mg total) by mouth daily.  Dispense: 30 tablet; Refill: 3 2. Type 2 diabetes 3. Asthma Prior pulmonary I have done the exam and reviewed the above chart and it is accurate to the best of my knowledge.   Miguel Aschoff MD [facility] 03/31/2016 3:24 PM

## 2016-04-12 ENCOUNTER — Encounter: Payer: Self-pay | Admitting: Family Medicine

## 2016-04-12 ENCOUNTER — Ambulatory Visit (INDEPENDENT_AMBULATORY_CARE_PROVIDER_SITE_OTHER): Payer: 59 | Admitting: Family Medicine

## 2016-04-12 VITALS — BP 128/58 | HR 76 | Temp 97.8°F | Resp 16 | Wt 234.4 lb

## 2016-04-12 DIAGNOSIS — R600 Localized edema: Secondary | ICD-10-CM

## 2016-04-12 NOTE — Patient Instructions (Signed)
Stop amlodipine and f/u with Dr. Rosanna Randy in 2-3 weeks. Come in sooner if swelling does not improve.

## 2016-04-12 NOTE — Progress Notes (Signed)
Subjective:     Patient ID: Brian Macdonald, male   DOB: [birth year], 65 y.o.   MRN: [id number]  HPI Chief Complaint  Patient presents with  . Joint Swelling    Patient comes in office today with concerns of swelling in both his ankles over the period of two weeks. Patient states that he has more swelling on the left side than on the right. Patient was seen by PA at [facility] today and diagnosed with "It pitting edema" on the right side, and left swollen non pitting.   States he plays golf and likes to spend time outside. Normal renal function on labs in April. States swelling goes down at night.  Review of Systems  Respiratory: Negative for shortness of breath.        Objective:   Physical Exam  Constitutional: He appears well-developed and well-nourished. No distress.  Cardiovascular: Normal rate and regular rhythm.   Pulmonary/Chest: Breath sounds normal.  Musculoskeletal: He exhibits edema (pedal edema  R> L).       Assessment:    1. Pedal edema: suspect due to amlodipine. Case discussed with Dr. Rosanna Randy.    Plan:    D/C amlodipine and f/u in 2 weeks. Return sooner if not improved.

## 2016-04-27 LAB — PSA: PSA: 1.2

## 2016-04-28 ENCOUNTER — Ambulatory Visit (INDEPENDENT_AMBULATORY_CARE_PROVIDER_SITE_OTHER): Payer: 59 | Admitting: Family Medicine

## 2016-04-28 VITALS — BP 138/76 | HR 68 | Resp 16 | Wt 227.0 lb

## 2016-04-28 DIAGNOSIS — E1169 Type 2 diabetes mellitus with other specified complication: Secondary | ICD-10-CM

## 2016-04-28 DIAGNOSIS — R600 Localized edema: Secondary | ICD-10-CM

## 2016-04-28 DIAGNOSIS — E785 Hyperlipidemia, unspecified: Secondary | ICD-10-CM | POA: Diagnosis not present

## 2016-04-28 DIAGNOSIS — I10 Essential (primary) hypertension: Secondary | ICD-10-CM

## 2016-04-28 DIAGNOSIS — E291 Testicular hypofunction: Secondary | ICD-10-CM

## 2016-04-28 NOTE — Progress Notes (Signed)
Patient ID: Brian Macdonald, male   DOB: 05-02-1951, 65 y.o.   MRN: [id number]    Subjective:  HPI  Patient is here for follow up: EDEMA: patient was told to stop Amlodipine in case it caused the swelling. Patient has been off the medication for 2 weeks and swelling has resolved. He has not been checking his b/p. BP Readings from Last 3 Encounters:  04/28/16 138/76  04/12/16 128/58  03/31/16 110/60   HYPERLIPIDEMIA: patient was started on Zetia on may 4th after reviewing his lipid results. Patient is due to have this re checked along with A1C. Lab Results  Component Value Date   CHOL 172 03/07/2016   HDL 32* 03/07/2016   LDLCALC 102* 03/07/2016   TRIG 192* 03/07/2016   Lab Results  Component Value Date   HGBA1C 6.5 01/14/2016  Pt does not feel any better since starting on testosterone cream.    Prior to Admission medications   Medication Sig Start Date End Date Taking? Authorizing Provider  amLODipine (NORVASC) 10 MG tablet Take by mouth. 03/19/15  Yes Historical Provider, MD  Aspirin 81 MG EC tablet Take by mouth. Reported on 01/14/2016 12/08/10  Yes Historical Provider, MD  budesonide (PULMICORT) 0.5 MG/2ML nebulizer solution Inhale into the lungs. 10/05/15 10/04/16 Yes Historical Provider, MD  carvedilol (COREG) 25 MG tablet Take by mouth. 04/02/15  Yes Historical Provider, MD  ezetimibe (ZETIA) 10 MG tablet Take 1 tablet (10 mg total) by mouth daily. 03/31/16  Yes Richard Maceo Pro., MD  fluticasone [facility]) 50 MCG/ACT nasal spray Place into the nose.   Yes Historical Provider, MD  hydrochlorothiazide (HYDRODIURIL) 25 MG tablet Take 1 tablet by mouth  daily 11/19/15  Yes Richard Maceo Pro., MD  losartan (COZAAR) 100 MG tablet Take 1 tablet (100 mg total) by mouth daily. 07/27/15  Yes Richard Maceo Pro., MD  metFORMIN (GLUCOPHAGE) 1000 MG tablet Take 1 tablet by mouth two  times daily 10/02/15  Yes Richard Maceo Pro., MD  montelukast (SINGULAIR) 10 MG tablet Take by mouth.    Yes Historical Provider, MD  MULTIPLE VITAMIN PO Take by mouth. 12/08/10  Yes Historical Provider, MD  omeprazole (PRILOSEC) 20 MG capsule Take 1 capsule by mouth  daily 03/24/16  Yes Richard Maceo Pro., MD  sildenafil (VIAGRA) 100 MG tablet Take by mouth. 06/18/14  Yes Historical Provider, MD  tamsulosin (FLOMAX) 0.4 MG CAPS capsule Take 1 capsule by mouth  every day 03/07/16  Yes Richard Maceo Pro., MD  Testosterone 20 % CREA by Does not apply route.   Yes Historical Provider, MD    Patient Active Problem List   Diagnosis Date Noted  . Ventral hernia 01/14/2016  . Asthma with allergic rhinitis 06/06/2015  . At risk for osteoporosis 06/06/2015  . Benign prostatic hypertrophy without urinary obstruction 06/06/2015  . Diabetes mellitus, type 2 ([place]) 06/06/2015  . Essential (primary) hypertension 06/06/2015  . Gastro-esophageal reflux disease without esophagitis 06/06/2015  . HLD (hyperlipidemia) 06/06/2015  . Failure of erection 06/06/2015  . Snores 06/06/2015  . Testicular hypofunction 06/06/2015  . Essential hypertension 11/02/2007  . SINUSITIS CHRONIC, UNSPECIFIED 11/02/2007  . ALLERGIC RHINITIS 11/02/2007    Past Medical History  Diagnosis Date  . Multiple allergies   . Diabetes mellitus without complication ([place])   . GERD (gastroesophageal reflux disease)   . Hypertension   . Restless leg syndrome   . Asthma   . Lipids blood increased   . Rhinitis, allergic   .  COPD (chronic obstructive pulmonary disease) ([place])   . Cancer [facility])     Social History   Social History  . Marital Status: Married    Spouse Name: N/A  . Number of Children: N/A  . Years of Education: N/A   Occupational History  . Not on file.   Social History Main Topics  . Smoking status: Never Smoker   . Smokeless tobacco: Never Used  . Alcohol Use: Yes     Comment: "about every day" " Doctor told me to have 2 drinks a night"  . Drug Use: No  . Sexual Activity: Not on file   Other Topics  Concern  . Not on file   Social History Narrative    No Known Allergies  Review of Systems  Constitutional: Negative.   HENT: Negative.   Eyes: Negative.   Respiratory: Negative.   Cardiovascular: Negative.   Gastrointestinal: Negative.   Genitourinary: Negative.   Musculoskeletal: Negative.   Skin: Negative.   Neurological: Negative.   Endo/Heme/Allergies: Negative.   Psychiatric/Behavioral: Negative.     Immunization History  Administered Date(s) Administered  . Pneumococcal Conjugate-13 02/04/2015  . Pneumococcal Polysaccharide-23 07/25/2011  . Td 04/15/2004  . Tdap 07/25/2011  . Zoster 10/12/2011   Objective:  BP 138/76 mmHg  Pulse 68  Resp 16  Wt 227 lb (102.967 kg)  Physical Exam  Constitutional: He is oriented to person, place, and time and well-developed, well-nourished, and in no distress.  HENT:  Head: Normocephalic and atraumatic.  Right Ear: External ear normal.  Left Ear: External ear normal.  Nose: Nose normal.  Eyes: Conjunctivae are normal. Pupils are equal, round, and reactive to light.  Neck: Normal range of motion. Neck supple.  Cardiovascular: Normal rate, regular rhythm, normal heart sounds and intact distal pulses.   Pulmonary/Chest: Effort normal and breath sounds normal. No respiratory distress.  Abdominal: Soft.  Musculoskeletal: Normal range of motion. He exhibits no edema or tenderness.  Neurological: He is alert and oriented to person, place, and time. Gait normal.  Skin: Skin is warm and dry.  Ruddy complexion of facre.  Psychiatric: Mood, memory, affect and judgment normal.    Lab Results  Component Value Date   WBC 8.7 07/09/2015   HGB 15.5 06/19/2014   HCT 44.6 07/09/2015   PLT 245 07/09/2015   GLUCOSE 112* 03/07/2016   CHOL 172 03/07/2016   TRIG 192* 03/07/2016   HDL 32* 03/07/2016   LDLCALC 102* 03/07/2016   TSH 1.720 07/09/2015   HGBA1C 6.5 01/14/2016    CMP     Component Value Date/Time   NA 136 03/07/2016  0849   K 4.7 03/07/2016 0849   CL 94* 03/07/2016 0849   CO2 26 03/07/2016 0849   GLUCOSE 112* 03/07/2016 0849   BUN 11 03/07/2016 0849   CREATININE 0.98 03/07/2016 0849   CREATININE 1.1 06/19/2014   CALCIUM 10.0 03/07/2016 0849   PROT 6.9 03/07/2016 0849   ALBUMIN 4.4 03/07/2016 0849   AST 29 03/07/2016 0849   ALT 26 03/07/2016 0849   ALKPHOS 102 03/07/2016 0849   BILITOT 0.5 03/07/2016 0849   GFRNONAA 80 03/07/2016 0849   GFRAA 92 03/07/2016 0849    Assessment and Plan :  1. Pedal edema Resolved. Stay off Amlodipine. More than 50% of time spent in counselling. 2. HLD (hyperlipidemia) Will re check levels on Zetia. - Lipid Panel With LDL/HDL Ratio  3. Type 2 diabetes mellitus with other specified complication (HCC) - HgB A1c  4.  Essential hypertension Stable off Amlodipine. Will not add another medication in place of it for now. Will re check on the next visit.  5. Testicular hypotension Patient is using a cream that is expensive and has been following Dr. Boneta Lucks. Discussed this with patient and patient will try and not use the cream and see if he develops any symptoms without using the cream and we will recheck levels on the next visit in August for CPE. May need to get back on the cream and may need [place] back to Dr. Yves Dill.Last PSA was 1.3 and last testosterone was 203 on testosterone cream. Pt will stay off of cream now as he does not feel any better on treatment. Reassess in 3 months.  Patient was seen and examined by Dr. Eulas Post and note was scribed by Theressa Millard, RMA.   I have done the exam and reviewed the above chart and it is accurate to the best of my knowledge.  Miguel Aschoff MD [facility] 04/28/2016 4:39 PM

## 2016-04-29 ENCOUNTER — Other Ambulatory Visit: Payer: Self-pay | Admitting: Family Medicine

## 2016-04-30 LAB — LIPID PANEL WITH LDL/HDL RATIO
CHOLESTEROL TOTAL: 158 mg/dL (ref 100–199)
HDL: 33 mg/dL — ABNORMAL LOW (ref >39)
LDL CALC: 90 mg/dL (ref 0–99)
LDL/HDL RATIO: 2.7 ratio (ref 0.0–3.6)
TRIGLYCERIDES: 177 mg/dL — AB (ref 0–149)
VLDL CHOLESTEROL CAL: 35 mg/dL (ref 5–40)

## 2016-04-30 LAB — HEMOGLOBIN A1C
Est. average glucose Bld gHb Est-mCnc: 140 mg/dL
Hgb A1c MFr Bld: 6.5 % — ABNORMAL HIGH (ref 4.8–5.6)

## 2016-05-13 ENCOUNTER — Other Ambulatory Visit: Payer: Self-pay

## 2016-05-13 DIAGNOSIS — E785 Hyperlipidemia, unspecified: Secondary | ICD-10-CM

## 2016-05-13 MED ORDER — EZETIMIBE 10 MG PO TABS: 10.0000 mg | ORAL_TABLET | Freq: Every day | ORAL | Status: DC

## 2016-05-18 ENCOUNTER — Encounter: Payer: Self-pay | Admitting: Family Medicine

## 2016-05-18 ENCOUNTER — Ambulatory Visit (INDEPENDENT_AMBULATORY_CARE_PROVIDER_SITE_OTHER): Payer: 59 | Admitting: Family Medicine

## 2016-05-18 VITALS — BP 120/64 | HR 66 | Temp 98.1°F | Resp 16 | Wt 229.0 lb

## 2016-05-18 DIAGNOSIS — R51 Headache: Secondary | ICD-10-CM | POA: Diagnosis not present

## 2016-05-18 DIAGNOSIS — E291 Testicular hypofunction: Secondary | ICD-10-CM

## 2016-05-18 DIAGNOSIS — R5383 Other fatigue: Secondary | ICD-10-CM

## 2016-05-18 DIAGNOSIS — L309 Dermatitis, unspecified: Secondary | ICD-10-CM | POA: Diagnosis not present

## 2016-05-18 DIAGNOSIS — R519 Headache, unspecified: Secondary | ICD-10-CM

## 2016-05-18 DIAGNOSIS — E785 Hyperlipidemia, unspecified: Secondary | ICD-10-CM | POA: Diagnosis not present

## 2016-05-18 MED ORDER — AMOXICILLIN 500 MG PO CAPS: 500.0000 mg | ORAL_CAPSULE | Freq: Three times a day (TID) | ORAL | Status: DC

## 2016-05-18 NOTE — Progress Notes (Signed)
Patient ID: Brian Macdonald, male   DOB: [birth year], 65 y.o.   MRN: [id number]       Patient: Brian Macdonald Male    DOB: 21-Mar-1951   65 y.o.   MRN: [id number] Visit Date: 05/18/2016  Today's Provider: Wilhemena Durie, MD   Chief Complaint  Patient presents with  . Fatigue    X 3 weeks.    Subjective:    HPI Patient reports that over the last 3 weeks, he has had no energy and a numbness and tingling sensation in various places (in hands, on top of head). Patient also has had lower back pain, irritability, headaches, and dizziness. He feels that it may be due to Zetia. Patient reports that started taking Zetia about 2 months ago, and his symptoms have slowly progressed since then. Patient reports that his symptoms have been at its worse over the last 3 weeks.     Allergies  Allergen Reactions  . Amlodipine Swelling    Edema-resolved after stopping this medication   Current Meds  Medication Sig  . Aspirin 81 MG EC tablet Take by mouth. Reported on 01/14/2016  . budesonide (PULMICORT) 0.5 MG/2ML nebulizer solution Inhale into the lungs.  . carvedilol (COREG) 25 MG tablet Take by mouth.  . ezetimibe (ZETIA) 10 MG tablet Take 1 tablet (10 mg total) by mouth daily.  . fluticasone (FLONASE) 50 MCG/ACT nasal spray Place into the nose.  . hydrochlorothiazide (HYDRODIURIL) 25 MG tablet Take 1 tablet by mouth  daily  . losartan (COZAAR) 100 MG tablet Take 1 tablet (100 mg total) by mouth daily.  . metFORMIN (GLUCOPHAGE) 1000 MG tablet Take 1 tablet by mouth two  times daily  . montelukast (SINGULAIR) 10 MG tablet Take by mouth.  . MULTIPLE VITAMIN PO Take by mouth.  Marland Kitchen omeprazole (PRILOSEC) 20 MG capsule Take 1 capsule by mouth  daily  . sildenafil (VIAGRA) 100 MG tablet Take by mouth.  . tamsulosin (FLOMAX) 0.4 MG CAPS capsule Take 1 capsule by mouth  every day  . Testosterone 20 % CREA by Does not apply route.    Review of Systems  Constitutional: Positive for activity change and  fatigue.  Musculoskeletal: Positive for myalgias, back pain and arthralgias.  Neurological: Positive for dizziness, weakness, light-headedness, numbness and headaches.  Psychiatric/Behavioral: Positive for agitation.    Social History  Substance Use Topics  . Smoking status: Never Smoker   . Smokeless tobacco: Never Used  . Alcohol Use: Yes     Comment: "about every day" " Doctor told me to have 2 drinks a night"   Objective:   BP 120/64 mmHg  Pulse 66  Temp(Src) 98.1 F (36.7 C)  Resp 16  Wt 229 lb (103.874 kg)  Physical Exam  Constitutional: He is oriented to person, place, and time. He appears well-developed and well-nourished.  HENT:  Head: Normocephalic and atraumatic.  Right Ear: External ear normal.  Left Ear: External ear normal.  Nose: Nose normal.  Mouth/Throat: Oropharynx is clear and moist.  Eyes: Conjunctivae and EOM are normal. Pupils are equal, round, and reactive to light.  Neck: Normal range of motion. Neck supple.  Cardiovascular: Normal rate, regular rhythm and normal heart sounds.   Pulmonary/Chest: Effort normal and breath sounds normal.  Abdominal: Soft. Bowel sounds are normal.  Neurological: He is alert and oriented to person, place, and time. No cranial nerve deficit. Coordination normal.  Skin: Skin is warm and dry. Rash noted. There is erythema.  Patient  has male pattern hair loss on the crown of the scalp. He is a baseball size area with some pustules. It is tender to the touch. There is no abscess present.  Psychiatric: He has a normal mood and affect. His behavior is normal. Judgment and thought content normal.        Assessment & Plan:     1. Other fatigue Obtain labs as below and return to clinic 1 week. - CBC with Differential/Platelet - TSH - Antinuclear Antib (ANA) - Comprehensive metabolic panel  2. Hypogonadism male  - Testosterone  3. Headache, unspecified headache type No imaging at this time. No neurologic symptoms. -  Sed Rate (ESR)  4. Hyperlipidemia  - Comprehensive metabolic panel  5. Dermatitis  - amoxicillin (AMOXIL) 500 MG capsule; Take 1 capsule (500 mg total) by mouth 3 (three) times daily.  Dispense: 30 capsule; Refill: 0 - Aerobic Culture 6. Cellulitis        Wilhemena Durie, MD  [facility]

## 2016-05-18 NOTE — Patient Instructions (Signed)
Discontinue Zetia.

## 2016-05-19 ENCOUNTER — Other Ambulatory Visit: Payer: Self-pay | Admitting: Family Medicine

## 2016-05-20 LAB — COMPREHENSIVE METABOLIC PANEL
ALBUMIN: 4.5 g/dL (ref 3.6–4.8)
ALT: 32 IU/L (ref 0–44)
AST: 32 IU/L (ref 0–40)
Albumin/Globulin Ratio: 1.7 (ref 1.2–2.2)
Alkaline Phosphatase: 82 IU/L (ref 39–117)
BUN / CREAT RATIO: 14 (ref 10–24)
BUN: 14 mg/dL (ref 8–27)
Bilirubin Total: 1 mg/dL (ref 0.0–1.2)
CALCIUM: 9.4 mg/dL (ref 8.6–10.2)
CO2: 28 mmol/L (ref 18–29)
CREATININE: 0.97 mg/dL (ref 0.76–1.27)
Chloride: 90 mmol/L — ABNORMAL LOW (ref 96–106)
GFR, EST AFRICAN AMERICAN: 94 mL/min/{1.73_m2} (ref >59)
GFR, EST NON AFRICAN AMERICAN: 81 mL/min/{1.73_m2} (ref >59)
GLUCOSE: 129 mg/dL — AB (ref 65–99)
Globulin, Total: 2.7 g/dL (ref 1.5–4.5)
Potassium: 4.2 mmol/L (ref 3.5–5.2)
Sodium: 140 mmol/L (ref 134–144)
TOTAL PROTEIN: 7.2 g/dL (ref 6.0–8.5)

## 2016-05-20 LAB — CBC WITH DIFFERENTIAL/PLATELET
BASOS ABS: 0 10*3/uL (ref 0.0–0.2)
Basos: 1 %
EOS (ABSOLUTE): 0.3 10*3/uL (ref 0.0–0.4)
EOS: 5 %
HEMATOCRIT: 46.9 % (ref 37.5–51.0)
HEMOGLOBIN: 16.1 g/dL (ref 12.6–17.7)
IMMATURE GRANS (ABS): 0 10*3/uL (ref 0.0–0.1)
Immature Granulocytes: 0 %
LYMPHS ABS: 1.4 10*3/uL (ref 0.7–3.1)
Lymphs: 25 %
MCH: 32.2 pg (ref 26.6–33.0)
MCHC: 34.3 g/dL (ref 31.5–35.7)
MCV: 94 fL (ref 79–97)
MONOCYTES: 7 %
Monocytes Absolute: 0.4 10*3/uL (ref 0.1–0.9)
NEUTROS ABS: 3.5 10*3/uL (ref 1.4–7.0)
NEUTROS PCT: 62 %
Platelets: 229 10*3/uL (ref 150–379)
RBC: 5 x10E6/uL (ref 4.14–5.80)
RDW: 14.3 % (ref 12.3–15.4)
WBC: 5.5 10*3/uL (ref 3.4–10.8)

## 2016-05-20 LAB — ANA: ANA: NEGATIVE

## 2016-05-20 LAB — TSH: TSH: 3.54 u[IU]/mL (ref 0.450–4.500)

## 2016-05-20 LAB — SEDIMENTATION RATE: SED RATE: 2 mm/h (ref 0–30)

## 2016-05-20 LAB — TESTOSTERONE: Testosterone: 201 ng/dL — ABNORMAL LOW (ref 348–1197)

## 2016-05-20 LAB — SPECIMEN STATUS REPORT

## 2016-05-22 LAB — AEROBIC CULTURE

## 2016-05-22 LAB — PLEASE NOTE

## 2016-05-23 ENCOUNTER — Telehealth: Payer: Self-pay | Admitting: Family Medicine

## 2016-05-23 ENCOUNTER — Other Ambulatory Visit: Payer: Self-pay

## 2016-05-23 MED ORDER — SULFAMETHOXAZOLE-TRIMETHOPRIM 800-160 MG PO TABS: 1.0000 | ORAL_TABLET | Freq: Two times a day (BID) | ORAL | Status: DC

## 2016-05-23 MED ORDER — HYDROCHLOROTHIAZIDE 25 MG PO TABS: ORAL_TABLET | ORAL | Status: DC

## 2016-05-23 NOTE — Telephone Encounter (Signed)
Culture was no growth but not  A surprise. Change Doxy to Keflex 500mg  BID id not severely PCN allergic.

## 2016-05-23 NOTE — Telephone Encounter (Signed)
Pt advised, RX sent in-aa 

## 2016-05-23 NOTE — Telephone Encounter (Signed)
Patient states that the sculp area is not any better with current treatment plan, he has follow up this week Wednesday. Does he need to do anything different before then? Also culture came back and need to know what the result means. Please review both questions-aa

## 2016-05-23 NOTE — Telephone Encounter (Signed)
Pt would like to give you information regarding a culture that was taken by Dr. Rosanna Randy

## 2016-05-23 NOTE — Telephone Encounter (Signed)
Rx Septra DS BID for 5 days with refill.

## 2016-05-25 ENCOUNTER — Encounter: Payer: Self-pay | Admitting: Family Medicine

## 2016-05-25 ENCOUNTER — Ambulatory Visit (INDEPENDENT_AMBULATORY_CARE_PROVIDER_SITE_OTHER): Payer: 59 | Admitting: Family Medicine

## 2016-05-25 VITALS — BP 110/70 | HR 67 | Temp 98.4°F | Resp 16 | Ht 74.0 in | Wt 225.0 lb

## 2016-05-25 DIAGNOSIS — I10 Essential (primary) hypertension: Secondary | ICD-10-CM

## 2016-05-25 DIAGNOSIS — E1122 Type 2 diabetes mellitus with diabetic chronic kidney disease: Secondary | ICD-10-CM | POA: Diagnosis not present

## 2016-05-25 DIAGNOSIS — E291 Testicular hypofunction: Secondary | ICD-10-CM | POA: Diagnosis not present

## 2016-05-25 DIAGNOSIS — N181 Chronic kidney disease, stage 1: Secondary | ICD-10-CM

## 2016-05-25 DIAGNOSIS — L03811 Cellulitis of head [any part, except face]: Secondary | ICD-10-CM

## 2016-05-25 DIAGNOSIS — J4540 Moderate persistent asthma, uncomplicated: Secondary | ICD-10-CM

## 2016-05-25 NOTE — Progress Notes (Signed)
Patient: Brian Macdonald Male    DOB: August 21, 1951   65 y.o.   MRN: [id number] Visit Date: 05/25/2016  Today's Provider: Wilhemena Durie, MD   Chief Complaint  Patient presents with  . Follow-up   Subjective:    HPI  Other fatigue From 05/18/2016-labs stable. Stopped Zetia 10 mg qd.  Dermatitis From 05/18/2016, started amoxicillin (AMOXIL) 500 MG capsule tid. Discontinued amoxicillin 05/23/2016 and started Septra DS BID for 5 days  Hypogonadism male From 05/18/2016-labs low 201     Allergies  Allergen Reactions  . Amlodipine Swelling    Edema-resolved after stopping this medication   Current Meds  Medication Sig  . Aspirin 81 MG EC tablet Take by mouth. Reported on 01/14/2016  . budesonide (PULMICORT) 0.5 MG/2ML nebulizer solution Inhale into the lungs.  . carvedilol (COREG) 25 MG tablet Take by mouth.  . fluticasone (FLONASE) 50 MCG/ACT nasal spray Place into the nose.  . hydrochlorothiazide (HYDRODIURIL) 25 MG tablet Take 1 tablet by mouth  daily  . losartan (COZAAR) 100 MG tablet Take 1 tablet (100 mg total) by mouth daily.  . metFORMIN (GLUCOPHAGE) 1000 MG tablet Take 1 tablet by mouth two  times daily  . montelukast (SINGULAIR) 10 MG tablet Take by mouth.  . MULTIPLE VITAMIN PO Take by mouth.  Marland Kitchen omeprazole (PRILOSEC) 20 MG capsule Take 1 capsule by mouth  daily  . sildenafil (VIAGRA) 100 MG tablet Take by mouth.  . sulfamethoxazole-trimethoprim (BACTRIM DS,SEPTRA DS) 800-160 MG tablet Take 1 tablet by mouth 2 (two) times daily.  . tamsulosin (FLOMAX) 0.4 MG CAPS capsule Take 1 capsule by mouth  every day  . Testosterone 20 % CREA by Does not apply route.    Review of Systems  Constitutional: Positive for fatigue. Negative for fever, chills and appetite change.  HENT: Negative.   Eyes: Negative.   Respiratory: Negative for chest tightness, shortness of breath and wheezing.   Cardiovascular: Negative for chest pain and palpitations.  Gastrointestinal:  Negative for nausea, vomiting and abdominal pain.  Endocrine: Negative.   Allergic/Immunologic: Negative.   Hematological: Negative.   Psychiatric/Behavioral: Negative.     Social History  Substance Use Topics  . Smoking status: Never Smoker   . Smokeless tobacco: Never Used  . Alcohol Use: Yes     Comment: "about every day" " Doctor told me to have 2 drinks a night"   Objective:   BP 110/70 mmHg  Pulse 67  Temp(Src) 98.4 F (36.9 C) (Oral)  Resp 16  Ht 6\' 2"  (1.88 m)  Wt 225 lb (102.059 kg)  BMI 28.88 kg/m2  SpO2 97%  Physical Exam  Constitutional: He is oriented to person, place, and time. He appears well-developed and well-nourished.  HENT:  Head: Normocephalic and atraumatic.  Right Ear: External ear normal.  Left Ear: External ear normal.  Nose: Nose normal.  Eyes: Conjunctivae are normal.  Neck: Neck supple.  Cardiovascular: Normal rate, regular rhythm and normal heart sounds.   Pulmonary/Chest: Effort normal and breath sounds normal.  Abdominal: Soft.  Neurological: He is alert and oriented to person, place, and time. No cranial nerve deficit. He exhibits normal muscle tone. Coordination normal.  Skin: Skin is warm and dry. Rash noted. There is erythema.  Markedly improved from last week. Evidently most of the improvement is been in the last 2 days we get the culture back and switched him to Septra.  Psychiatric: He has a normal mood and affect. His  behavior is normal. Judgment and thought content normal.        Assessment & Plan:     Cellulitis Improving.More than 50% of visit is spent in counseling regarding these issues. Hypogonadism Low level of 203 despite being on topical therapy.Discussed referral back to Dr Yves Dill for Rx options. Including clomid. TIIDM AR/Asthma Fatigue I have done the exam and reviewed the above chart and it is accurate to the best of my knowledge.        Brystal Kildow Cranford Mon, MD  [facility]

## 2016-05-26 ENCOUNTER — Other Ambulatory Visit: Payer: Self-pay | Admitting: Family Medicine

## 2016-05-27 ENCOUNTER — Telehealth: Payer: Self-pay | Admitting: Emergency Medicine

## 2016-05-27 MED ORDER — LOSARTAN POTASSIUM 100 MG PO TABS: 100.0000 mg | ORAL_TABLET | Freq: Every day | ORAL | Status: DC

## 2016-05-27 MED ORDER — LOSARTAN POTASSIUM 100 MG PO TABS
100.0000 mg | ORAL_TABLET | Freq: Every day | ORAL | Status: DC
Start: 2016-05-27 — End: 2016-12-21

## 2016-05-27 NOTE — Telephone Encounter (Signed)
Pt needed 90 day supply to mail order and 30 day supply to local as he was out of medication. He was last seen on 05/18/16. Refills sent.

## 2016-06-09 ENCOUNTER — Other Ambulatory Visit: Payer: Self-pay | Admitting: Family Medicine

## 2016-06-27 ENCOUNTER — Ambulatory Visit (INDEPENDENT_AMBULATORY_CARE_PROVIDER_SITE_OTHER): Payer: 59 | Admitting: Family Medicine

## 2016-06-27 ENCOUNTER — Encounter: Payer: Self-pay | Admitting: Family Medicine

## 2016-06-27 VITALS — BP 118/60 | HR 74 | Resp 16 | Wt 216.0 lb

## 2016-06-27 DIAGNOSIS — E1122 Type 2 diabetes mellitus with diabetic chronic kidney disease: Secondary | ICD-10-CM

## 2016-06-27 DIAGNOSIS — I10 Essential (primary) hypertension: Secondary | ICD-10-CM

## 2016-06-27 DIAGNOSIS — E785 Hyperlipidemia, unspecified: Secondary | ICD-10-CM | POA: Diagnosis not present

## 2016-06-27 DIAGNOSIS — K219 Gastro-esophageal reflux disease without esophagitis: Secondary | ICD-10-CM

## 2016-06-27 DIAGNOSIS — N181 Chronic kidney disease, stage 1: Secondary | ICD-10-CM

## 2016-06-27 DIAGNOSIS — E291 Testicular hypofunction: Secondary | ICD-10-CM | POA: Diagnosis not present

## 2016-06-27 MED ORDER — FISH OIL 1000 MG PO CAPS: ORAL_CAPSULE | ORAL | 0 refills | Status: DC

## 2016-06-27 MED ORDER — FLAX SEED OIL 1000 MG PO CAPS: ORAL_CAPSULE | ORAL | 0 refills | Status: DC

## 2016-06-27 NOTE — Progress Notes (Signed)
Subjective:  HPI  Patient is here to discuss a few issues.  1) Has skin spots on top of his head again where it was infected before and was treated with Amox. 2) Would like to discuss his testosterone and the treatment. 3) He is loosing weight and wonders should he try to cut back on b/p medication doses, seems b/p readings getting lower with his loosing weight. Wt Readings from Last 3 Encounters:  06/27/16 216 lb (98 kg)  05/25/16 225 lb (102.1 kg)  05/18/16 229 lb (103.9 kg)   BP Readings from Last 3 Encounters:  06/27/16 118/60  05/25/16 110/70  05/18/16 120/64   Prior to Admission medications   Medication Sig Start Date End Date Taking? Authorizing Provider  Aspirin 81 MG EC tablet Take by mouth. Reported on 01/14/2016 12/08/10  Yes Historical Provider, MD  budesonide (PULMICORT) 0.5 MG/2ML nebulizer solution Inhale into the lungs. 10/05/15 10/04/16 Yes Historical Provider, MD  carvedilol (COREG) 25 MG tablet Take 1 tablet by mouth two  times daily 06/10/16  Yes Ansleigh Safer Maceo Pro., MD  fluticasone [facility]) 50 MCG/ACT nasal spray Place into the nose.   Yes Historical Provider, MD  hydrochlorothiazide (HYDRODIURIL) 25 MG tablet Take 1 tablet by mouth  daily 05/23/16  Yes Jahnya Trindade Maceo Pro., MD  losartan (COZAAR) 100 MG tablet Take 1 tablet (100 mg total) by mouth daily. 05/27/16  Yes Atziri Zubiate Maceo Pro., MD  metFORMIN (GLUCOPHAGE) 1000 MG tablet Take 1 tablet by mouth two  times daily 10/02/15  Yes Katja Blue Maceo Pro., MD  montelukast (SINGULAIR) 10 MG tablet Take by mouth.   Yes Historical Provider, MD  MULTIPLE VITAMIN PO Take by mouth. 12/08/10  Yes Historical Provider, MD  omeprazole (PRILOSEC) 20 MG capsule Take 1 capsule by mouth  daily 03/24/16  Yes Latorsha Curling Maceo Pro., MD  sildenafil (VIAGRA) 100 MG tablet Take by mouth. 06/18/14  Yes Historical Provider, MD  tamsulosin (FLOMAX) 0.4 MG CAPS capsule Take 1 capsule by mouth  every day 03/07/16  Yes Grey Schlauch Maceo Pro.,  MD    Patient Active Problem List   Diagnosis Date Noted  . Ventral hernia 01/14/2016  . Asthma with allergic rhinitis 06/06/2015  . At risk for osteoporosis 06/06/2015  . Benign prostatic hypertrophy without urinary obstruction 06/06/2015  . Diabetes mellitus, type 2 ([place]) 06/06/2015  . Essential (primary) hypertension 06/06/2015  . Gastro-esophageal reflux disease without esophagitis 06/06/2015  . HLD (hyperlipidemia) 06/06/2015  . Failure of erection 06/06/2015  . Snores 06/06/2015  . Testicular hypofunction 06/06/2015  . Essential hypertension 11/02/2007  . SINUSITIS CHRONIC, UNSPECIFIED 11/02/2007  . ALLERGIC RHINITIS 11/02/2007    Past Medical History:  Diagnosis Date  . Asthma   . Cancer ([place])   . COPD (chronic obstructive pulmonary disease) ([place])   . Diabetes mellitus without complication ([place])   . GERD (gastroesophageal reflux disease)   . Hypertension   . Lipids blood increased   . Multiple allergies   . Restless leg syndrome   . Rhinitis, allergic     Social History   Social History  . Marital status: Married    Spouse name: N/A  . Number of children: N/A  . Years of education: N/A   Occupational History  . Not on file.   Social History Main Topics  . Smoking status: Never Smoker  . Smokeless tobacco: Never Used  . Alcohol use Yes     Comment: "about every day" " Doctor told  me to have 2 drinks a night"  . Drug use: No  . Sexual activity: Not on file   Other Topics Concern  . Not on file   Social History Narrative  . No narrative on file    Allergies  Allergen Reactions  . Amlodipine Swelling    Edema-resolved after stopping this medication    Review of Systems  Constitutional: Negative.   HENT: Negative.   Respiratory: Negative.   Cardiovascular: Negative.   Musculoskeletal: Negative.   Psychiatric/Behavioral: Negative.     Immunization History  Administered Date(s) Administered  . Pneumococcal Conjugate-13 02/04/2015  .  Pneumococcal Polysaccharide-23 07/25/2011  . Td 04/15/2004  . Tdap 07/25/2011  . Zoster 10/12/2011   Objective:  BP 118/60   Pulse 74   Resp 16   Wt 216 lb (98 kg)   BMI 27.73 kg/m   Physical Exam  Constitutional: He is oriented to person, place, and time and well-developed, well-nourished, and in no distress.  HENT:  Head: Normocephalic and atraumatic.  Eyes: Conjunctivae are normal. Pupils are equal, round, and reactive to light.  Neck: Normal range of motion. Neck supple.  Cardiovascular: Normal rate, regular rhythm, normal heart sounds and intact distal pulses.   No murmur heard. Pulmonary/Chest: Effort normal and breath sounds normal. No respiratory distress. He has no wheezes.  Musculoskeletal: He exhibits no edema.  Neurological: He is alert and oriented to person, place, and time. Gait normal.  Psychiatric: Mood, memory, affect and judgment normal.    Lab Results  Component Value Date   WBC 5.5 05/19/2016   HGB 15.5 06/19/2014   HCT 46.9 05/19/2016   PLT 229 05/19/2016   GLUCOSE 129 (H) 05/19/2016   CHOL 158 04/29/2016   TRIG 177 (H) 04/29/2016   HDL 33 (L) 04/29/2016   LDLCALC 90 04/29/2016   TSH 3.540 05/19/2016   PSA 1.2 04/27/2016   HGBA1C 6.5 (H) 04/29/2016    CMP     Component Value Date/Time   NA 140 05/19/2016 0822   K 4.2 05/19/2016 0822   CL 90 (L) 05/19/2016 0822   CO2 28 05/19/2016 0822   GLUCOSE 129 (H) 05/19/2016 0822   BUN 14 05/19/2016 0822   CREATININE 0.97 05/19/2016 0822   CALCIUM 9.4 05/19/2016 0822   PROT 7.2 05/19/2016 0822   ALBUMIN 4.5 05/19/2016 0822   AST 32 05/19/2016 0822   ALT 32 05/19/2016 0822   ALKPHOS 82 05/19/2016 0822   BILITOT 1.0 05/19/2016 0822   GFRNONAA 81 05/19/2016 0822   GFRAA 94 05/19/2016 0822    Assessment and Plan :  1. Essential hypertension With patient loosing weight will decrease Carvedilol to 1/2 tablet twice daily. Will follow. His b/p running lower and heart rate running lower per readings  he has gotten.  2. Type 2 diabetes mellitus with stage 1 chronic kidney disease, unspecified long term insulin use status ([place])  3. Hypogonadism male Patient discussed treatment options with Dr. Rogers Blocker. Discussed trying Depo Progesterone injection and possible side effects from treatments. Will follow without treatment for his Testosterone and let patient continue working on loosing weight and working on habits.  4. Gastro-esophageal reflux disease without esophagitis Stable.  5. Hyperlipidemia Discussed with patient taking Fish oil and Niacin following.  Patient was seen and examined by Dr. Eulas Post and note was scribed by Theressa Millard, RMA.   Miguel Aschoff MD [facility] 06/27/2016 10:29 AM

## 2016-07-13 ENCOUNTER — Encounter: Payer: 59 | Admitting: Family Medicine

## 2016-07-14 ENCOUNTER — Other Ambulatory Visit: Payer: Self-pay

## 2016-07-14 MED ORDER — METFORMIN HCL 1000 MG PO TABS: 1000.0000 mg | ORAL_TABLET | Freq: Two times a day (BID) | ORAL | 0 refills | Status: DC

## 2016-07-14 NOTE — Telephone Encounter (Signed)
Pt called requesting refill of Metformin be sent to [facility] while he waits for mail order to arrive. Has already spoken with pharmacy, who does agree to fill prescription. Renaldo Fiddler, CMA

## 2016-07-20 ENCOUNTER — Ambulatory Visit (INDEPENDENT_AMBULATORY_CARE_PROVIDER_SITE_OTHER): Payer: 59 | Admitting: Family Medicine

## 2016-07-20 ENCOUNTER — Encounter: Payer: Self-pay | Admitting: Family Medicine

## 2016-07-20 VITALS — BP 128/62 | HR 72 | Temp 97.7°F | Resp 16 | Ht 74.0 in | Wt 214.0 lb

## 2016-07-20 DIAGNOSIS — Z0000 Encounter for general adult medical examination without abnormal findings: Secondary | ICD-10-CM | POA: Diagnosis not present

## 2016-07-20 NOTE — Progress Notes (Signed)
Patient: Brian Macdonald, Male    DOB: 06/10/1951, 65 y.o.   MRN: [id number] Visit Date: 07/20/2016  Today's Provider: Wilhemena Durie, MD   Chief Complaint  Patient presents with  . Annual Exam   Subjective:    Annual physical exam Brian Macdonald is a 65 y.o. male who presents today for health maintenance and complete physical. He feels well. He reports exercising 5 days a week for about 30-45 mins each day.  He reports he is sleeping well.  Colonoscopy- 09/11/2015. Tubular adenoma.    Review of Systems  Constitutional: Negative.   HENT: Negative.   Eyes: Negative.   Respiratory: Negative.   Cardiovascular: Negative.   Gastrointestinal: Negative.   Endocrine: Negative.   Genitourinary: Negative.   Musculoskeletal: Negative.   Skin: Negative.   Allergic/Immunologic: Negative.   Neurological: Negative.   Hematological: Negative.   Psychiatric/Behavioral: Negative.     Social History      He  reports that he has never smoked. He has never used smokeless tobacco. He reports that he drinks alcohol. He reports that he does not use drugs.       Social History   Social History  . Marital status: Married    Spouse name: N/A  . Number of children: N/A  . Years of education: N/A   Social History Main Topics  . Smoking status: Never Smoker  . Smokeless tobacco: Never Used  . Alcohol use Yes     Comment: "about every day" " Doctor told me to have 2 drinks a night"  . Drug use: No  . Sexual activity: Not Asked   Other Topics Concern  . None   Social History Narrative  . None    Past Medical History:  Diagnosis Date  . Asthma   . Cancer ([place])   . COPD (chronic obstructive pulmonary disease) ([place])   . Diabetes mellitus without complication ([place])   . GERD (gastroesophageal reflux disease)   . Hypertension   . Lipids blood increased   . Multiple allergies   . Restless leg syndrome   . Rhinitis, allergic      Patient Active Problem List   Diagnosis Date Noted  . Ventral hernia 01/14/2016  . Asthma with allergic rhinitis 06/06/2015  . At risk for osteoporosis 06/06/2015  . Benign prostatic hypertrophy without urinary obstruction 06/06/2015  . Diabetes mellitus, type 2 ([place]) 06/06/2015  . Essential (primary) hypertension 06/06/2015  . Gastro-esophageal reflux disease without esophagitis 06/06/2015  . HLD (hyperlipidemia) 06/06/2015  . Failure of erection 06/06/2015  . Snores 06/06/2015  . Testicular hypofunction 06/06/2015  . Essential hypertension 11/02/2007  . SINUSITIS CHRONIC, UNSPECIFIED 11/02/2007  . ALLERGIC RHINITIS 11/02/2007    Past Surgical History:  Procedure Laterality Date  . COLONOSCOPY WITH PROPOFOL N/A 09/11/2015   Procedure: COLONOSCOPY WITH PROPOFOL;  Surgeon: Josefine Class, MD;  Location[facility] ENDOSCOPY;  Service: Endoscopy;  Laterality: N/A;  . NASAL SINUS SURGERY  03/03/08   Due to sinus infection, breathing problems and passage was " messed up"  . SKIN CANCER EXCISION  12/2011   basal cell carcinoma  . VASECTOMY  1985  . VASECTOMY      Family History        Family Status  Relation Status  . Mother Alive  . Father Deceased   died in 04-Mar-2007 from bladder cancer  . Sister Alive  . Brother Alive  . Sister Alive  . Brother Alive  His family history includes Diabetes in his mother; Hypertension in his mother.    Allergies  Allergen Reactions  . Amlodipine Swelling    Edema-resolved after stopping this medication    Current Meds  Medication Sig  . Aspirin 81 MG EC tablet Take by mouth. Reported on 01/14/2016  . budesonide (PULMICORT) 0.5 MG/2ML nebulizer solution Inhale into the lungs.  . carvedilol (COREG) 25 MG tablet Take 1 tablet by mouth two  times daily (Patient taking differently: Take 1/2 tablet by mouth two  times daily)  . Flaxseed, Linseed, (FLAX SEED OIL) 1000 MG CAPS 1 daily  . fluticasone (FLONASE) 50 MCG/ACT nasal spray Place into the nose.  . hydrochlorothiazide  (HYDRODIURIL) 25 MG tablet Take 1 tablet by mouth  daily  . losartan (COZAAR) 100 MG tablet Take 1 tablet (100 mg total) by mouth daily.  . metFORMIN (GLUCOPHAGE) 1000 MG tablet Take 1 tablet (1,000 mg total) by mouth 2 (two) times daily.  . montelukast (SINGULAIR) 10 MG tablet Take by mouth.  . MULTIPLE VITAMIN PO Take by mouth.  . Omega-3 Fatty Acids (FISH OIL) 1000 MG CAPS 1 to 2 daily  . omeprazole (PRILOSEC) 20 MG capsule Take 1 capsule by mouth  daily  . sildenafil (VIAGRA) 100 MG tablet Take by mouth.  . tamsulosin (FLOMAX) 0.4 MG CAPS capsule Take 1 capsule by mouth  every day    Patient Care Team: Jerrol Banana., MD as PCP - General (Family Medicine)     Objective:   Vitals: BP 128/62 (BP Location: Right Arm, Patient Position: Sitting, Cuff Size: Normal)   Pulse 72   Temp 97.7 F (36.5 C)   Resp 16   Ht 6\' 2"  (1.88 m)   Wt 214 lb (97.1 kg)   BMI 27.48 kg/m    Physical Exam  Constitutional: He is oriented to person, place, and time. He appears well-developed and well-nourished.  HENT:  Head: Normocephalic and atraumatic.  Right Ear: External ear normal.  Left Ear: External ear normal.  Nose: Nose normal.  Mouth/Throat: Oropharynx is clear and moist.  Eyes: Conjunctivae are normal.  Neck: Neck supple.  Cardiovascular: Normal rate, regular rhythm, normal heart sounds and intact distal pulses.   Pulmonary/Chest: Effort normal and breath sounds normal.  Abdominal: Soft.  Neurological: He is alert and oriented to person, place, and time.  Skin: Skin is warm and dry.  Psychiatric: He has a normal mood and affect. His behavior is normal. Judgment and thought content normal.  DRE per urology.   Depression Screen PHQ 2/9 Scores 09/02/2015  PHQ - 2 Score 0      Assessment & Plan:     Routine Health Maintenance and Physical Exam  Exercise Activities and Dietary recommendations Goals    None      Immunization History  Administered Date(s)  Administered  . Pneumococcal Conjugate-13 02/04/2015  . Pneumococcal Polysaccharide-23 07/25/2011  . Td 04/15/2004  . Tdap 07/25/2011  . Zoster 10/12/2011    Health Maintenance  Topic Date Due  . Hepatitis C Screening  [birth year]  . FOOT EXAM  08/16/1961  . INFLUENZA VACCINE  09/01/2016 (Originally 06/28/2016)  . PNA vac Low Risk Adult (2 of 2 - PPSV23) 07/24/2016  . OPHTHALMOLOGY EXAM  07/29/2016  . HEMOGLOBIN A1C  10/29/2016  . TETANUS/TDAP  07/24/2021  . COLONOSCOPY  09/10/2025  . ZOSTAVAX  Completed      Discussed health benefits of physical activity, and encouraged him to engage in regular exercise  appropriate for his age and condition.      I have done the exam and reviewed the above chart and it is accurate to the best of my knowledge.   Richard Cranford Mon, MD  [facility]

## 2016-09-01 ENCOUNTER — Ambulatory Visit
Admission: EM | Admit: 2016-09-01 | Discharge: 2016-09-01 | Disposition: A | Discharge status: Home / Self Care | Payer: 59 | Attending: Family Medicine | Admitting: Family Medicine

## 2016-09-01 ENCOUNTER — Encounter: Payer: Self-pay | Admitting: *Deleted

## 2016-09-01 DIAGNOSIS — M62830 Muscle spasm of back: Secondary | ICD-10-CM | POA: Diagnosis not present

## 2016-09-01 DIAGNOSIS — M5442 Lumbago with sciatica, left side: Secondary | ICD-10-CM

## 2016-09-01 MED ORDER — KETOROLAC TROMETHAMINE 60 MG/2ML IM SOLN
60.0000 mg | Freq: Once | INTRAMUSCULAR | Status: AC
  Administered 2016-09-01: 60 mg via INTRAMUSCULAR

## 2016-09-01 MED ORDER — CELECOXIB 400 MG PO CAPS: 400.0000 mg | ORAL_CAPSULE | Freq: Every day | ORAL | 0 refills | Status: DC

## 2016-09-01 MED ORDER — PREDNISONE 10 MG (21) PO TBPK: ORAL_TABLET | ORAL | 0 refills | Status: DC

## 2016-09-01 MED ORDER — METAXALONE 800 MG PO TABS: 800.0000 mg | ORAL_TABLET | Freq: Three times a day (TID) | ORAL | 0 refills | Status: DC

## 2016-09-01 NOTE — ED Triage Notes (Signed)
Low back pain x1 month, onset during golf. Past 4 days pain has worsened. Pt has been seeing chiropractor without relief and referred here today by chiropractor. Denies radiation of pain.

## 2016-09-01 NOTE — ED Provider Notes (Signed)
[facility]    CSN: [id number] Arrival date & time: 09/01/16  1213     History   Chief Complaint Chief Complaint  Patient presents with  . Back Pain    HPI Brian Macdonald is a 65 y.o. male.   Patient was sent here by Dr. Oswaldo Milian because of back pain. Apparently about 3 weeks ago he started having muscle spasms in his back locking up on him. He was seen degrees for orthopedic and actually had MRI of the lumbar spine which showed degenerative disc disease and some disc disease. He states that they offered to do a cortisone injection in the disc but he heard that repeated injections sometime needed and the repeated injections have loss of side effects as far as necrosis and other problems so he is trying to stay away from a back injection. He has been seeing Dr. Oswaldo Milian but states he did have muscle spasms and this weekend the muscle spasms got worse. The muscle spasms with worry recently on the right side but now they're just on the left. Dr. Camillo Flaming work on him this week but because of the increased pain and discomfort thought that he should be seen and evaluated and medication changes needed. Should be noted that he is on Robaxin 500 mg twice a day he is on a anti-inflammatory with PPI combination and they have not really helped her much. He finally took some tramadol that was given to him yesterday and was able to get some sleep last night.  He has a history of pulmonary problems has been on oral steroids before he says COPD diabetes GERD hypertension and hyperlipidemia. He's had a vasectomy colonoscopy nasal sinus surgery and skin cancer excision. He does not smoke and his mother had diabetes and hypertension.   The history is provided by the patient. No language interpreter was used.  Back Pain  Location:  Lumbar spine and sacro-iliac joint Quality:  Aching, burning, cramping and stabbing Radiates to:  R posterior upper leg and R thigh Pain severity:  Severe Pain is:   Same all the time Onset quality:  Sudden Timing:  Constant Progression:  Worsening Chronicity:  New Context: twisting   Context: not falling and not recent illness   Relieved by:  Nothing Worsened by:  Movement Ineffective treatments:  Muscle relaxants, NSAIDs and narcotics Associated symptoms: leg pain   Risk factors: no steroid use and no vascular disease     Past Medical History:  Diagnosis Date  . Asthma   . Cancer ([place])   . COPD (chronic obstructive pulmonary disease) ([place])   . Diabetes mellitus without complication ([place])   . GERD (gastroesophageal reflux disease)   . Hypertension   . Lipids blood increased   . Multiple allergies   . Restless leg syndrome   . Rhinitis, allergic     Patient Active Problem List   Diagnosis Date Noted  . Ventral hernia 01/14/2016  . Asthma with allergic rhinitis 06/06/2015  . At risk for osteoporosis 06/06/2015  . Benign prostatic hypertrophy without urinary obstruction 06/06/2015  . Diabetes mellitus, type 2 ([place]) 06/06/2015  . Essential (primary) hypertension 06/06/2015  . Gastro-esophageal reflux disease without esophagitis 06/06/2015  . HLD (hyperlipidemia) 06/06/2015  . Failure of erection 06/06/2015  . Snores 06/06/2015  . Testicular hypofunction 06/06/2015  . Essential hypertension 11/02/2007  . SINUSITIS CHRONIC, UNSPECIFIED 11/02/2007  . ALLERGIC RHINITIS 11/02/2007    Past Surgical History:  Procedure Laterality Date  . COLONOSCOPY WITH PROPOFOL N/A  09/11/2015   Procedure: COLONOSCOPY WITH PROPOFOL;  Surgeon: Josefine Class, MD;  Location: [facility] ENDOSCOPY;  Service: Endoscopy;  Laterality: N/A;  . NASAL SINUS SURGERY  2009   Due to sinus infection, breathing problems and passage was " messed up"  . SKIN CANCER EXCISION  12/2011   basal cell carcinoma  . VASECTOMY  1985  . VASECTOMY         Home Medications    Prior to Admission medications   Medication Sig Start Date End Date Taking? Authorizing Provider   Aspirin 81 MG EC tablet Take by mouth. Reported on 01/14/2016 12/08/10  Yes Historical Provider, MD  carvedilol (COREG) 25 MG tablet Take 1 tablet by mouth two  times daily Patient taking differently: Take 1/2 tablet by mouth two  times daily 06/10/16  Yes Richard Maceo Pro., MD  Flaxseed, Linseed, (FLAX SEED OIL) 1000 MG CAPS 1 daily 06/27/16  Yes Jerrol Banana., MD  fluticasone [facility]) 50 MCG/ACT nasal spray Place into the nose.   Yes Historical Provider, MD  hydrochlorothiazide (HYDRODIURIL) 25 MG tablet Take 1 tablet by mouth  daily 05/23/16  Yes Richard Maceo Pro., MD  losartan (COZAAR) 100 MG tablet Take 1 tablet (100 mg total) by mouth daily. 05/27/16  Yes Richard Maceo Pro., MD  metFORMIN (GLUCOPHAGE) 1000 MG tablet Take 1 tablet (1,000 mg total) by mouth 2 (two) times daily. 07/14/16  Yes Richard Maceo Pro., MD  MULTIPLE VITAMIN PO Take by mouth. 12/08/10  Yes Historical Provider, MD  Omega-3 Fatty Acids (FISH OIL) 1000 MG CAPS 1 to 2 daily 06/27/16  Yes Richard Maceo Pro., MD  omeprazole (PRILOSEC) 20 MG capsule Take 1 capsule by mouth  daily 03/24/16  Yes Richard Maceo Pro., MD  tamsulosin [facility]) 0.4 MG CAPS capsule Take 1 capsule by mouth  every day 03/07/16  Yes Richard Maceo Pro., MD  budesonide (PULMICORT) 0.5 MG/2ML nebulizer solution Inhale into the lungs. 10/05/15 10/04/16  Historical Provider, MD  celecoxib (CELEBREX) 400 MG capsule Take 1 capsule (400 mg total) by mouth daily after breakfast. Do not take w/other antiinflamatory 09/01/16   Frederich Cha, MD  metaxalone (SKELAXIN) 800 MG tablet Take 1 tablet (800 mg total) by mouth 3 (three) times daily. 09/01/16   Frederich Cha, MD  montelukast (SINGULAIR) 10 MG tablet Take by mouth.    Historical Provider, MD  predniSONE (STERAPRED UNI-PAK 21 TAB) 10 MG (21) TBPK tablet 6 tabs day 1 and 2, 5 tabs day 3 and 4, 4 tabs day 5 and 6, 3 tabs day 7 and 8, 2 tabs day 9 and 10, 1 tab day 11 and 12. Take orally 09/01/16    Frederich Cha, MD  sildenafil (VIAGRA) 100 MG tablet Take by mouth. 06/18/14   Historical Provider, MD    Family History Family History  Problem Relation Age of Onset  . Diabetes Mother   . Hypertension Mother     Social History Social History  Substance Use Topics  . Smoking status: Never Smoker  . Smokeless tobacco: Never Used  . Alcohol use Yes     Comment: "about every day" " Doctor told me to have 2 drinks a night"     Allergies   Amlodipine   Review of Systems Review of Systems  Musculoskeletal: Positive for back pain, gait problem and myalgias.  All other systems reviewed and are negative.    Physical Exam Triage Vital Signs ED Triage Vitals  Enc Vitals Group     BP 09/01/16 1307 (!) 148/75     Pulse Rate 09/01/16 1307 62     Resp 09/01/16 1307 16     Temp 09/01/16 1307 97.7 F (36.5 C)     Temp Source 09/01/16 1307 Oral     SpO2 09/01/16 1307 100 %     Weight 09/01/16 1309 210 lb (95.3 kg)     Height 09/01/16 1309 6\' 2"  (1.88 m)     Head Circumference --      Peak Flow --      Pain Score 09/01/16 1314 8     Pain Loc --      Pain Edu? --      Excl. in [place]? --    No data found.   Updated Vital Signs BP (!) 148/75 (BP Location: Left Arm)   Pulse 62   Temp 97.7 F (36.5 C) (Oral)   Resp 16   Ht 6\' 2"  (1.88 m)   Wt 210 lb (95.3 kg)   SpO2 100%   BMI 26.96 kg/m   Visual Acuity Right Eye Distance:   Left Eye Distance:   Bilateral Distance:    Right Eye Near:   Left Eye Near:    Bilateral Near:     Physical Exam  Constitutional: He is oriented to person, place, and time. He appears well-developed and well-nourished. He appears distressed.  HENT:  Head: Normocephalic and atraumatic.  Right Ear: External ear normal.  Left Ear: External ear normal.  Eyes: Pupils are equal, round, and reactive to light.  Neck: Normal range of motion. Neck supple.  Pulmonary/Chest: Effort normal.  Musculoskeletal: He exhibits tenderness.       Lumbar back:  He exhibits decreased range of motion, tenderness and spasm.       Back:  Neurological: He is alert and oriented to person, place, and time.  Skin: Skin is warm. He is not diaphoretic.  Psychiatric: He has a normal mood and affect. His behavior is normal.  Vitals reviewed.    UC Treatments / Results  Labs (all labs ordered are listed, but only abnormal results are displayed) Labs Reviewed - No data to display  EKG  EKG Interpretation None       Radiology No results found.  Procedures Procedures (including critical care time)  Medications Ordered in UC Medications  ketorolac (TORADOL) injection 60 mg (not administered)     Initial Impression / Assessment and Plan / UC Course  I have reviewed the triage vital signs and the nursing notes.  Pertinent labs & imaging results that were available during my care of the patient were reviewed by me and considered in my medical decision making (see chart for details).  Clinical Course    Patient with marked muscle spasms on the left lower back iliac sacral area. We will place him on a 12 day decreasing steroid dose pack stop the anti-inflammatory that he was placed and will use Celebrex 400 mg 1 capsule a day go with the Celebrex since he needs a Cox 2 because of this age, the Robaxin is not working so we will try Skelaxin 800 mg 3 times a day he has that for insurance so hopefully they will cover this. He may continue using the tramadol since that has helped him sleep at night but I did recommend injection Toradol because while he is still and sitting the pain is 4 out of 10 but he does admit that when he starts  moving it goes up to 10 out of 10. Follow-up with Dr. Oswaldo Milian on Monday work note given for today and tomorrow  Final Clinical Impressions(s) / UC Diagnoses   Final diagnoses:  Acute right-sided low back pain with left-sided sciatica  Muscle spasm of back    New Prescriptions New Prescriptions   CELECOXIB (CELEBREX)  400 MG CAPSULE    Take 1 capsule (400 mg total) by mouth daily after breakfast. Do not take w/other antiinflamatory   METAXALONE (SKELAXIN) 800 MG TABLET    Take 1 tablet (800 mg total) by mouth 3 (three) times daily.   PREDNISONE (STERAPRED UNI-PAK 21 TAB) 10 MG (21) TBPK TABLET    6 tabs day 1 and 2, 5 tabs day 3 and 4, 4 tabs day 5 and 6, 3 tabs day 7 and 8, 2 tabs day 9 and 10, 1 tab day 11 and 12. Take orally     Frederich Cha, MD 09/01/16 [phone]

## 2016-10-26 ENCOUNTER — Ambulatory Visit: Payer: 59 | Admitting: Family Medicine

## 2016-11-03 ENCOUNTER — Encounter: Payer: Self-pay | Admitting: Family Medicine

## 2016-11-03 ENCOUNTER — Ambulatory Visit (INDEPENDENT_AMBULATORY_CARE_PROVIDER_SITE_OTHER): Payer: 59 | Admitting: Family Medicine

## 2016-11-03 VITALS — BP 124/68 | HR 76 | Temp 97.8°F | Resp 16 | Wt 220.0 lb

## 2016-11-03 DIAGNOSIS — R3915 Urgency of urination: Secondary | ICD-10-CM

## 2016-11-03 DIAGNOSIS — E1122 Type 2 diabetes mellitus with diabetic chronic kidney disease: Secondary | ICD-10-CM

## 2016-11-03 DIAGNOSIS — I10 Essential (primary) hypertension: Secondary | ICD-10-CM | POA: Diagnosis not present

## 2016-11-03 DIAGNOSIS — N181 Chronic kidney disease, stage 1: Secondary | ICD-10-CM

## 2016-11-03 LAB — POCT GLYCOSYLATED HEMOGLOBIN (HGB A1C): HEMOGLOBIN A1C: 6.1

## 2016-11-03 NOTE — Progress Notes (Signed)
Subjective:  HPI  Diabetes Mellitus Type II, Follow-up:   Lab Results  Component Value Date   HGBA1C 6.5 (H) 04/29/2016   HGBA1C 6.5 01/14/2016   HGBA1C 6.7 (H) 07/09/2015    Last seen for diabetes 4 months ago.  Management since then includes none. He reports good compliance with treatment. He is not having side effects.  Home blood sugar records: not being checked  Episodes of hypoglycemia? no Current exercise: walking  Pertinent Labs:    Component Value Date/Time   CHOL 158 04/29/2016 0858   TRIG 177 (H) 04/29/2016 0858   HDL 33 (L) 04/29/2016 0858   LDLCALC 90 04/29/2016 0858   CREATININE 0.97 05/19/2016 0822    Wt Readings from Last 3 Encounters:  11/03/16 220 lb (99.8 kg)  09/01/16 210 lb (95.3 kg)  07/20/16 214 lb (97.1 kg)    ------------------------------------------------------------------------   Hypertension, follow-up:  BP Readings from Last 3 Encounters:  11/03/16 124/68  09/01/16 (!) 148/75  07/20/16 128/62    He was last seen for hypertension 4 months ago.  BP at that visit was 148/75. Management since that visit includes cut back on Carvedilol by half, pt did this and felt flushed, so he started back the whole dose. Flushing feeling resolved He reports good compliance with treatment. He is not having side effects.  He is experiencing none.  Patient denies chest pain, chest pressure/discomfort, claudication, dyspnea, exertional chest pressure/discomfort, fatigue, irregular heart beat, lower extremity edema, near-syncope, orthopnea, palpitations, paroxysmal nocturnal dyspnea, syncope and tachypnea.     Wt Readings from Last 3 Encounters:  11/03/16 220 lb (99.8 kg)  09/01/16 210 lb (95.3 kg)  07/20/16 214 lb (97.1 kg)   ------------------------------------------------------------------------    Prior to Admission medications   Medication Sig Start Date End Date Taking? Authorizing Provider  Aspirin 81 MG EC tablet Take by mouth.  Reported on 01/14/2016 12/08/10   Historical Provider, MD  budesonide (PULMICORT) 0.5 MG/2ML nebulizer solution Inhale into the lungs. 10/05/15 10/04/16  Historical Provider, MD  carvedilol (COREG) 25 MG tablet Take 1 tablet by mouth two  times daily Patient taking differently: Take 1/2 tablet by mouth two  times daily 06/10/16   Jerrol Banana., MD  celecoxib (CELEBREX) 400 MG capsule Take 1 capsule (400 mg total) by mouth daily after breakfast. Do not take w/other antiinflamatory 09/01/16   Frederich Cha, MD  Flaxseed, Linseed, (FLAX SEED OIL) 1000 MG CAPS 1 daily 06/27/16   Jerrol Banana., MD  fluticasone [facility]) 50 MCG/ACT nasal spray Place into the nose.    Historical Provider, MD  hydrochlorothiazide (HYDRODIURIL) 25 MG tablet Take 1 tablet by mouth  daily 05/23/16   Jerrol Banana., MD  losartan (COZAAR) 100 MG tablet Take 1 tablet (100 mg total) by mouth daily. 05/27/16   Larken Urias Maceo Pro., MD  metaxalone (SKELAXIN) 800 MG tablet Take 1 tablet (800 mg total) by mouth 3 (three) times daily. 09/01/16   Frederich Cha, MD  metFORMIN (GLUCOPHAGE) 1000 MG tablet Take 1 tablet (1,000 mg total) by mouth 2 (two) times daily. 07/14/16   Phelicia Dantes Maceo Pro., MD  montelukast (SINGULAIR) 10 MG tablet Take by mouth.    Historical Provider, MD  MULTIPLE VITAMIN PO Take by mouth. 12/08/10   Historical Provider, MD  Omega-3 Fatty Acids (FISH OIL) 1000 MG CAPS 1 to 2 daily 06/27/16   Jerrol Banana., MD  omeprazole (PRILOSEC) 20 MG capsule Take 1 capsule  by mouth  daily 03/24/16   Jerrol Banana., MD  predniSONE (STERAPRED UNI-PAK 21 TAB) 10 MG (21) TBPK tablet 6 tabs day 1 and 2, 5 tabs day 3 and 4, 4 tabs day 5 and 6, 3 tabs day 7 and 8, 2 tabs day 9 and 10, 1 tab day 11 and 12. Take orally 09/01/16   Frederich Cha, MD  sildenafil (VIAGRA) 100 MG tablet Take by mouth. 06/18/14   Historical Provider, MD  tamsulosin (FLOMAX) 0.4 MG CAPS capsule Take 1 capsule by mouth  every day 03/07/16    Jerrol Banana., MD    Patient Active Problem List   Diagnosis Date Noted  . Ventral hernia 01/14/2016  . Asthma with allergic rhinitis 06/06/2015  . At risk for osteoporosis 06/06/2015  . Benign prostatic hypertrophy without urinary obstruction 06/06/2015  . Diabetes mellitus, type 2 ([place]) 06/06/2015  . Essential (primary) hypertension 06/06/2015  . Gastro-esophageal reflux disease without esophagitis 06/06/2015  . HLD (hyperlipidemia) 06/06/2015  . Failure of erection 06/06/2015  . Snores 06/06/2015  . Testicular hypofunction 06/06/2015  . Essential hypertension 11/02/2007  . SINUSITIS CHRONIC, UNSPECIFIED 11/02/2007  . ALLERGIC RHINITIS 11/02/2007    Past Medical History:  Diagnosis Date  . Asthma   . Cancer ([place])   . COPD (chronic obstructive pulmonary disease) ([place])   . Diabetes mellitus without complication ([place])   . GERD (gastroesophageal reflux disease)   . Hypertension   . Lipids blood increased   . Multiple allergies   . Restless leg syndrome   . Rhinitis, allergic     Social History   Social History  . Marital status: Married    Spouse name: N/A  . Number of children: N/A  . Years of education: N/A   Occupational History  . Not on file.   Social History Main Topics  . Smoking status: Never Smoker  . Smokeless tobacco: Never Used  . Alcohol use Yes     Comment: "about every day" " Doctor told me to have 2 drinks a night"  . Drug use: No  . Sexual activity: Not on file   Other Topics Concern  . Not on file   Social History Narrative  . No narrative on file    Allergies  Allergen Reactions  . Amlodipine Swelling    Edema-resolved after stopping this medication    Review of Systems  Constitutional: Negative.   HENT: Negative.   Eyes: Negative.   Respiratory: Negative.   Cardiovascular: Negative.   Gastrointestinal: Negative.   Genitourinary: Positive for urgency.  Musculoskeletal: Negative.   Skin: Negative.   Neurological:  Negative.   Endo/Heme/Allergies: Negative.   Psychiatric/Behavioral: Negative.     Immunization History  Administered Date(s) Administered  . Pneumococcal Conjugate-13 02/04/2015  . Pneumococcal Polysaccharide-23 07/25/2011  . Td 04/15/2004  . Tdap 07/25/2011  . Zoster 10/12/2011    Objective:  BP 124/68 (BP Location: Left Arm, Patient Position: Sitting, Cuff Size: Normal)   Pulse 76   Temp 97.8 F (36.6 C) (Oral)   Resp 16   Wt 220 lb (99.8 kg)   BMI 28.25 kg/m   Physical Exam  Constitutional: He is oriented to person, place, and time and well-developed, well-nourished, and in no distress.  HENT:  Head: Normocephalic and atraumatic.  Eyes: Conjunctivae and EOM are normal. Pupils are equal, round, and reactive to light.  Neck: Normal range of motion. Neck supple.  Cardiovascular: Normal rate, regular rhythm, normal heart sounds and intact  distal pulses.   Pulmonary/Chest: Effort normal and breath sounds normal.  Abdominal: Soft. Bowel sounds are normal.  Musculoskeletal: Normal range of motion.  Neurological: He is alert and oriented to person, place, and time. He has normal reflexes. GCS score is 15.  Skin: Skin is warm and dry.  Psychiatric: Mood, memory, affect and judgment normal.   Diabetic Foot Exam - Simple   Simple Foot Form Diabetic Foot exam was performed with the following findings:  Yes 11/03/2016  8:48 AM  Visual Inspection No deformities, no ulcerations, no other skin breakdown bilaterally:  Yes Sensation Testing Intact to touch and monofilament testing bilaterally:  Yes Pulse Check Posterior Tibialis and Dorsalis pulse intact bilaterally:  Yes Comments     Lab Results  Component Value Date   WBC 5.5 05/19/2016   HGB 15.5 06/19/2014   HCT 46.9 05/19/2016   PLT 229 05/19/2016   GLUCOSE 129 (H) 05/19/2016   CHOL 158 04/29/2016   TRIG 177 (H) 04/29/2016   HDL 33 (L) 04/29/2016   LDLCALC 90 04/29/2016   TSH 3.540 05/19/2016   PSA 1.2 04/27/2016     HGBA1C 6.5 (H) 04/29/2016   MICROALBUR 50 07/09/2015    CMP     Component Value Date/Time   NA 140 05/19/2016 0822   K 4.2 05/19/2016 0822   CL 90 (L) 05/19/2016 0822   CO2 28 05/19/2016 0822   GLUCOSE 129 (H) 05/19/2016 0822   BUN 14 05/19/2016 0822   CREATININE 0.97 05/19/2016 0822   CALCIUM 9.4 05/19/2016 0822   PROT 7.2 05/19/2016 0822   ALBUMIN 4.5 05/19/2016 0822   AST 32 05/19/2016 0822   ALT 32 05/19/2016 0822   ALKPHOS 82 05/19/2016 0822   BILITOT 1.0 05/19/2016 0822   GFRNONAA 81 05/19/2016 0822   GFRAA 94 05/19/2016 0822    Assessment and Plan :  1. Type 2 diabetes mellitus with stage 1 chronic kidney disease, unspecified long term insulin use status (HCC)  - POCT HgB A1C 6.1 today. Stable. Follow up 4 months  2. Essential hypertension Taking full dose of Carvedilol. Stable.   3. Urinary urgency  - Ambulatory referral to Urology 4.BPH 5.AR 6Asthma Obtain BMD 2-3 years to check for ostoporosis  7.OSA 8.Hypogonadism   HPI, Exam, and A&P Transcribed under the direction and in the presence of Ferris Tally L. Cranford Mon, MD  Electronically Signed: Webb Laws, CMA I have done the exam and reviewed the above chart and it is accurate to the best of my knowledge. Development worker, community has been used in this note in any air is in the dictation or transcription are unintentional. [facility] 11/03/2016 8:36 AM

## 2016-12-20 ENCOUNTER — Telehealth: Payer: Self-pay

## 2016-12-20 NOTE — Telephone Encounter (Signed)
Pt is requesting an call back from of Dr. Alben Spittle CMA's to discuss medications. CB# [phone]. Renaldo Fiddler, CMA

## 2016-12-21 MED ORDER — HYDROCHLOROTHIAZIDE 25 MG PO TABS: ORAL_TABLET | ORAL | 3 refills | Status: DC

## 2016-12-21 MED ORDER — OMEPRAZOLE 20 MG PO CPDR: 20.0000 mg | DELAYED_RELEASE_CAPSULE | Freq: Every day | ORAL | 3 refills | Status: DC

## 2016-12-21 MED ORDER — METFORMIN HCL 1000 MG PO TABS: 1000.0000 mg | ORAL_TABLET | Freq: Two times a day (BID) | ORAL | 3 refills | Status: DC

## 2016-12-21 MED ORDER — LOSARTAN POTASSIUM 100 MG PO TABS: 100.0000 mg | ORAL_TABLET | Freq: Every day | ORAL | 3 refills | Status: DC

## 2016-12-21 MED ORDER — CARVEDILOL 25 MG PO TABS: 25.0000 mg | ORAL_TABLET | Freq: Two times a day (BID) | ORAL | 3 refills | Status: DC

## 2016-12-21 NOTE — Telephone Encounter (Signed)
Done-aa 

## 2016-12-21 NOTE — Telephone Encounter (Signed)
Patient reports that he is no longer using [facility] due to insurance changes. Patient reports that he is only using [facility]. Patient would like these medications sent into the pharmacy. Thanks!

## 2016-12-29 ENCOUNTER — Encounter: Payer: Self-pay | Admitting: Urology

## 2016-12-29 ENCOUNTER — Ambulatory Visit (INDEPENDENT_AMBULATORY_CARE_PROVIDER_SITE_OTHER): Payer: PPO | Admitting: Urology

## 2016-12-29 VITALS — BP 145/79 | HR 63 | Ht 74.0 in | Wt 226.9 lb

## 2016-12-29 DIAGNOSIS — N400 Enlarged prostate without lower urinary tract symptoms: Secondary | ICD-10-CM

## 2016-12-29 DIAGNOSIS — R3915 Urgency of urination: Secondary | ICD-10-CM | POA: Diagnosis not present

## 2016-12-29 DIAGNOSIS — N3281 Overactive bladder: Secondary | ICD-10-CM

## 2016-12-29 DIAGNOSIS — N5201 Erectile dysfunction due to arterial insufficiency: Secondary | ICD-10-CM

## 2016-12-29 DIAGNOSIS — Z125 Encounter for screening for malignant neoplasm of prostate: Secondary | ICD-10-CM | POA: Diagnosis not present

## 2016-12-29 DIAGNOSIS — E291 Testicular hypofunction: Secondary | ICD-10-CM | POA: Diagnosis not present

## 2016-12-29 LAB — MICROSCOPIC EXAMINATION: Bacteria, UA: NONE SEEN

## 2016-12-29 LAB — URINALYSIS, COMPLETE
BILIRUBIN UA: NEGATIVE
Glucose, UA: NEGATIVE
KETONES UA: NEGATIVE
LEUKOCYTES UA: NEGATIVE
Nitrite, UA: NEGATIVE
PROTEIN UA: NEGATIVE
RBC UA: NEGATIVE
SPEC GRAV UA: 1.015 (ref 1.005–1.030)
Urobilinogen, Ur: 0.2 mg/dL (ref 0.2–1.0)
pH, UA: 7 (ref 5.0–7.5)

## 2016-12-29 LAB — BLADDER SCAN AMB NON-IMAGING: Scan Result: 86

## 2016-12-29 MED ORDER — MIRABEGRON ER 25 MG PO TB24: 25.0000 mg | ORAL_TABLET | Freq: Every day | ORAL | 11 refills | Status: DC

## 2016-12-29 NOTE — Progress Notes (Signed)
12/29/2016 10:21 AM   Brian Macdonald 1951-04-18 [id number]  Referring provider: Jerrol Banana., MD [address] [place], [place] [address]  Chief Complaint  Patient presents with  . Urinary Urgency    HPI: The patient is a 66 year old gentleman who presents today to transfer his care for hypogonadism, ED, and BPH.  1. BPH He is on flomax 0.4 mg. his biggest complaint at this time is urinary urgency with occasional urge incontinence. He denies nocturia. He has a good stream. He has no intermittency. He feels like he empties his bladder. Denies frequency.  2. ED On sildenafil 20 mg which works well for him  3. Hypogonadism  He has been on Axiron in the past. He noticed no change in his symptoms with this medication. His testosterone levels never seemed to rise in this medication. His only complaint though is erectile dysfunction which is well managed with sildenafil. He denies any significant fatigue or weight gain.  Testosterone (June 2017): 201 TSH (June 2017): 3.5 (normal) PSA (May 2017): 1.2  PMH: Past Medical History:  Diagnosis Date  . Asthma   . Cancer ([place])   . COPD (chronic obstructive pulmonary disease) ([place])   . Diabetes mellitus without complication ([place])   . GERD (gastroesophageal reflux disease)   . Hypertension   . Lipids blood increased   . Multiple allergies   . Restless leg syndrome   . Rhinitis, allergic     Surgical History: Past Surgical History:  Procedure Laterality Date  . COLONOSCOPY WITH PROPOFOL N/A 09/11/2015   Procedure: COLONOSCOPY WITH PROPOFOL;  Surgeon: Josefine Class, MD;  Location[facility];  Service: Endoscopy;  Laterality: N/A;  . NASAL SINUS SURGERY  2009   Due to sinus infection, breathing problems and passage was " messed up"  . SKIN CANCER EXCISION  12/2011   basal cell carcinoma  . VASECTOMY  1985  . VASECTOMY      Home Medications:  Allergies as of 12/29/2016      Reactions   Amlodipine Swelling   Edema-resolved after stopping this medication      Medication List       Accurate as of 12/29/16 10:21 AM. Always use your most recent med list.          Aspirin 81 MG EC tablet Take by mouth. Reported on 01/14/2016   budesonide 0.5 MG/2ML nebulizer solution Commonly known as:  PULMICORT Inhale into the lungs.   carvedilol 25 MG tablet Commonly known as:  COREG Take 1 tablet (25 mg total) by mouth 2 (two) times daily.   celecoxib 400 MG capsule Commonly known as:  CELEBREX Take 1 capsule (400 mg total) by mouth daily after breakfast. Do not take w/other antiinflamatory   DUEXIS 800-26.6 MG Tabs Generic drug:  Ibuprofen-Famotidine Take 1 tablet by mouth 3 (three) times daily.   Fish Oil 1000 MG Caps 1 to 2 daily   Flax Seed Oil 1000 MG Caps 1 daily   FLONASE 50 MCG/ACT nasal spray Generic drug:  fluticasone Place into the nose.   hydrochlorothiazide 25 MG tablet Commonly known as:  HYDRODIURIL Take 1 tablet by mouth  daily   losartan 100 MG tablet Commonly known as:  COZAAR Take 1 tablet (100 mg total) by mouth daily.   metaxalone 800 MG tablet Commonly known as:  SKELAXIN Take 1 tablet (800 mg total) by mouth 3 (three) times daily.   metFORMIN 1000 MG tablet Commonly known as:  GLUCOPHAGE Take  1 tablet (1,000 mg total) by mouth 2 (two) times daily.   MULTIPLE VITAMIN PO Take by mouth.   omeprazole 20 MG capsule Commonly known as:  PRILOSEC Take 1 capsule (20 mg total) by mouth daily.   SINGULAIR 10 MG tablet Generic drug:  montelukast Take by mouth.   tamsulosin 0.4 MG Caps capsule Commonly known as:  FLOMAX Take 1 capsule by mouth  every day   VIAGRA 100 MG tablet Generic drug:  sildenafil Take by mouth.       Allergies:  Allergies  Allergen Reactions  . Amlodipine Swelling    Edema-resolved after stopping this medication    Family History: Family History  Problem Relation Age of Onset  . Diabetes Mother   .  Hypertension Mother   . Prostate cancer Father   . Bladder Cancer Neg Hx   . Kidney cancer Neg Hx     Social History:  reports that he has never smoked. He has never used smokeless tobacco. He reports that he drinks alcohol. He reports that he does not use drugs.  ROS: UROLOGY Frequent Urination?: No Hard to postpone urination?: Yes Burning/pain with urination?: No Get up at night to urinate?: Yes Leakage of urine?: No Urine stream starts and stops?: No Trouble starting stream?: No Do you have to strain to urinate?: No Blood in urine?: No Urinary tract infection?: No Sexually transmitted disease?: No Injury to kidneys or bladder?: No Painful intercourse?: No Weak stream?: No Erection problems?: No Penile pain?: No  Gastrointestinal Nausea?: No Vomiting?: No Indigestion/heartburn?: No Diarrhea?: No Constipation?: No  Constitutional Fever: No Night sweats?: No Weight loss?: No Fatigue?: No  Skin Skin rash/lesions?: No Itching?: No  Eyes Blurred vision?: No Double vision?: No  Ears/Nose/Throat Sore throat?: No Sinus problems?: No  Hematologic/Lymphatic Swollen glands?: No Easy bruising?: No  Cardiovascular Leg swelling?: No Chest pain?: No  Respiratory Cough?: No Shortness of breath?: No  Endocrine Excessive thirst?: No  Musculoskeletal Back pain?: Yes Joint pain?: No  Neurological Headaches?: No Dizziness?: No  Psychologic Depression?: No Anxiety?: No  Physical Exam: BP (!) 145/79 (BP Location: Left Arm, Patient Position: Sitting, Cuff Size: Large)   Pulse 63   Ht 6\' 2"  (1.88 m)   Wt 226 lb 14.4 oz (102.9 kg)   BMI 29.13 kg/m   Constitutional:  Alert and oriented, No acute distress. HEENT: [place] AT, moist mucus membranes.  Trachea midline, no masses. Cardiovascular: No clubbing, cyanosis, or edema. Respiratory: Normal respiratory effort, no increased work of breathing. GI: Abdomen is soft, nontender, nondistended, no abdominal  masses GU: No CVA tenderness. Normal phallus. Testicles descended equally bilaterally without masses. DRE: 2+ smooth benign. Skin: No rashes, bruises or suspicious lesions. Lymph: No cervical or inguinal adenopathy. Neurologic: Grossly intact, no focal deficits, moving all 4 extremities. Psychiatric: Normal mood and affect.  Laboratory Data: Lab Results  Component Value Date   WBC 5.5 05/19/2016   HGB 15.5 06/19/2014   HCT 46.9 05/19/2016   MCV 94 05/19/2016   PLT 229 05/19/2016    Lab Results  Component Value Date   CREATININE 0.97 05/19/2016    Lab Results  Component Value Date   PSA 1.2 04/27/2016    Lab Results  Component Value Date   TESTOSTERONE 201 (L) 05/19/2016    Lab Results  Component Value Date   HGBA1C 6.1 11/03/2016    Urinalysis No results found for: COLORURINE, APPEARANCEUR, [place], [place], [place], [place], [place], [place], PROTEINUR, UROBILINOGEN, NITRITE, LEUKOCYTESUR  Assessment & Plan:    1. OAB The patient's symptoms are more suggestive of an overactive bladder than from BPH. We'll start the patient on Myrbetriq 25 mg daily. If this is too expensive, we can try an anticholinergic.  2. BPH We'll stop Flomax at this time as it has not helped his symptoms. See above.  3. Erectile dysfunction Continue sildenafil 20 mg 1-5 tablets.  4. Hypogonadism The patient's only symptom is erectile dysfunction which is well-controlled with sildenafil. No further treatment indicated.  5. Prostate cancer screening Due for PSA today  No Follow-up on file.  Nickie Retort, MD  [facility] [address] [place], [place] [address] [phone]

## 2016-12-30 ENCOUNTER — Telehealth: Payer: Self-pay

## 2016-12-30 LAB — PSA TOTAL (REFLEX TO FREE): Prostate Specific Ag, Serum: 0.9 ng/mL (ref 0.0–4.0)

## 2016-12-30 NOTE — Telephone Encounter (Signed)
Nickie Retort, MD  Lestine Box, LPN        Please let patient know PSA is normal    Spoke with pt in reference to lab results. Pt voiced understanding.

## 2017-01-12 DIAGNOSIS — E119 Type 2 diabetes mellitus without complications: Secondary | ICD-10-CM | POA: Diagnosis not present

## 2017-01-12 DIAGNOSIS — E089 Diabetes mellitus due to underlying condition without complications: Secondary | ICD-10-CM | POA: Diagnosis not present

## 2017-01-12 DIAGNOSIS — H4389 Other disorders of vitreous body: Secondary | ICD-10-CM | POA: Diagnosis not present

## 2017-01-12 DIAGNOSIS — H2513 Age-related nuclear cataract, bilateral: Secondary | ICD-10-CM | POA: Diagnosis not present

## 2017-01-25 LAB — HM DIABETES EYE EXAM

## 2017-02-16 ENCOUNTER — Ambulatory Visit: Payer: Self-pay | Admitting: Medical

## 2017-02-16 ENCOUNTER — Encounter: Payer: Self-pay | Admitting: Medical

## 2017-02-16 ENCOUNTER — Other Ambulatory Visit: Payer: Self-pay

## 2017-02-16 VITALS — BP 140/82 | HR 63 | Temp 97.2°F | Resp 16 | Ht 74.0 in | Wt 229.0 lb

## 2017-02-16 DIAGNOSIS — B001 Herpesviral vesicular dermatitis: Secondary | ICD-10-CM

## 2017-02-16 MED ORDER — VALACYCLOVIR HCL 1 G PO TABS: 1000.0000 mg | ORAL_TABLET | Freq: Two times a day (BID) | ORAL | 0 refills | Status: DC

## 2017-02-16 NOTE — Progress Notes (Signed)
Patient reports that fever blister broke out on lower lip on Monday (3days ago).  Painful.  He's tried Abreva and lysin.

## 2017-02-16 NOTE — Progress Notes (Signed)
   Subjective:    Patient ID: Brian Macdonald, male    DOB: 10/17/1951, 66 y.o.   MRN: [id number]  HPI  66 yo male  started Sunday night with lower lip tingling and then blisters appeared on Monday. Has 3  lower lip blisters. Spent the weekend outside golfing and he thinks either the wind or sun brought these on. Previous history. Not taking anything for pain. Has tried lysine and Abreva with no results. He is back playing golf and his happy his back is better.   Review of Systems  Constitutional: Negative for chills and fever.  HENT: Positive for mouth sores.   Eyes: Negative.   Respiratory: Negative.   Cardiovascular: Negative.        Objective:   Physical Exam  Constitutional: He is oriented to person, place, and time. He appears well-developed and well-nourished.  HENT:  Head: Normocephalic and atraumatic.  Eyes: EOM are normal. Pupils are equal, round, and reactive to light.  Musculoskeletal: Normal range of motion.  Neurological: He is alert and oriented to person, place, and time.    Lower lip with 3 blisters , ruptured. Lip with mild swelling.        Assessment & Plan:  Herpes Labialis  prescribed.valtrex 1000mg  take 2 tablets by for 2 doses. Avoid hot, spicy foods, acidic ( he eats orange cuties every morning) (reviewed food types with patient) or salty foods. Take otc motrin or tylenol as needed for pain, take as directed. Return to the clinic as needed.

## 2017-03-07 ENCOUNTER — Ambulatory Visit: Payer: Self-pay | Admitting: Medical

## 2017-03-08 ENCOUNTER — Encounter: Payer: Self-pay | Admitting: Medical

## 2017-03-08 ENCOUNTER — Ambulatory Visit: Payer: Self-pay | Admitting: Medical

## 2017-03-08 VITALS — BP 130/80 | HR 77 | Temp 97.6°F | Resp 16 | Ht 74.0 in | Wt 228.0 lb

## 2017-03-08 DIAGNOSIS — R059 Cough, unspecified: Secondary | ICD-10-CM

## 2017-03-08 DIAGNOSIS — R05 Cough: Secondary | ICD-10-CM

## 2017-03-08 DIAGNOSIS — J40 Bronchitis, not specified as acute or chronic: Secondary | ICD-10-CM

## 2017-03-08 MED ORDER — AMOXICILLIN-POT CLAVULANATE 875-125 MG PO TABS: 1.0000 | ORAL_TABLET | Freq: Two times a day (BID) | ORAL | 0 refills | Status: DC

## 2017-03-08 MED ORDER — FLUTICASONE-SALMETEROL 250-50 MCG/DOSE IN AEPB: 1.0000 | INHALATION_SPRAY | Freq: Two times a day (BID) | RESPIRATORY_TRACT | 1 refills | Status: DC

## 2017-03-08 NOTE — Progress Notes (Signed)
   Subjective:    Patient ID: Brian Macdonald, male    DOB: 01/14/1951, 66 y.o.   MRN: [id number]  HPI 66 yo with cough x 7 days, blowing nose , little yellow., initially sore throat but now that has  resolved.  No wheezing. Thought he had a cold , but cough continued to worsen. History adult asthma. Not using his inhaler.    Review of Systems  Constitutional: Positive for fatigue. Negative for chills and fever.  HENT: Positive for congestion, rhinorrhea and sinus pressure. Negative for ear pain, postnasal drip, sinus pain, sneezing, sore throat, tinnitus, trouble swallowing and voice change.   Eyes: Negative.   Respiratory: Positive for cough and shortness of breath. Negative for wheezing.   Cardiovascular: Negative for chest pain, palpitations and leg swelling.  Gastrointestinal: Negative for diarrhea, nausea and vomiting.  Neurological: Positive for headaches.  Hematological: Negative for adenopathy.  Psychiatric/Behavioral: Negative.        Objective:   Physical Exam  Constitutional: He is oriented to person, place, and time. He appears well-developed and well-nourished.  HENT:  Head: Normocephalic and atraumatic.  Right Ear: External ear normal.  Left Ear: External ear normal.  Nose: Nose normal.  Eyes: EOM are normal. Pupils are equal, round, and reactive to light.  Neck: Normal range of motion. Neck supple.  Cardiovascular: Normal rate and regular rhythm.  Exam reveals no gallop and no friction rub.   No murmur heard. Pulmonary/Chest: Effort normal. He has wheezes.  Musculoskeletal: Normal range of motion.  Lymphadenopathy:    He has no cervical adenopathy.  Neurological: He is alert and oriented to person, place, and time.  Skin: Skin is warm and dry.  Psychiatric: He has a normal mood and affect. His behavior is normal.  Nursing note and vitals reviewed.  Cough noted in room ending with a wheeze.       Assessment & Plan:   Viral Bronchitis and cough To stay  on flonase and allegra. To use albuterol  MDI  2- 3 x a day. Prescribed Advair 250/50mcg  Twice daily rinse mouth after eating . Written for Augmenting 875-125mg  one tablet by mouth twice daily x  10 days no refills if he starts coughing up yellow or green discharge. Or yellow or green discharge from nares occurs. Return to clinic in  3 -5 days if not improving. Reviewed cough hygiene with patient.

## 2017-03-13 ENCOUNTER — Ambulatory Visit: Payer: 59 | Admitting: Family Medicine

## 2017-03-29 ENCOUNTER — Ambulatory Visit: Payer: PPO

## 2017-04-06 ENCOUNTER — Ambulatory Visit (INDEPENDENT_AMBULATORY_CARE_PROVIDER_SITE_OTHER): Payer: PPO | Admitting: Family Medicine

## 2017-04-06 VITALS — BP 116/58 | HR 76 | Temp 97.6°F | Resp 18 | Wt 229.0 lb

## 2017-04-06 DIAGNOSIS — R739 Hyperglycemia, unspecified: Secondary | ICD-10-CM

## 2017-04-06 DIAGNOSIS — J45909 Unspecified asthma, uncomplicated: Secondary | ICD-10-CM

## 2017-04-06 DIAGNOSIS — K219 Gastro-esophageal reflux disease without esophagitis: Secondary | ICD-10-CM

## 2017-04-06 DIAGNOSIS — I10 Essential (primary) hypertension: Secondary | ICD-10-CM | POA: Diagnosis not present

## 2017-04-06 DIAGNOSIS — E7849 Other hyperlipidemia: Secondary | ICD-10-CM

## 2017-04-06 DIAGNOSIS — E291 Testicular hypofunction: Secondary | ICD-10-CM

## 2017-04-06 DIAGNOSIS — E784 Other hyperlipidemia: Secondary | ICD-10-CM | POA: Diagnosis not present

## 2017-04-06 NOTE — Progress Notes (Signed)
Brian Macdonald  MRN: [id number] DOB: 04-12-1951  Subjective:  HPI  Patient is here for 4 months follow up. Diabetes: not checking sugar at home. No numbness or tingling in hands or feet. Patient is walking now. Lab Results  Component Value Date   HGBA1C 6.1 11/03/2016   B/P: not checking his b/p. No cardiac symptoms. BP Readings from Last 3 Encounters:  04/06/17 (!) 116/58  03/08/17 130/80  02/16/17 140/82   Wt Readings from Last 3 Encounters:  04/06/17 229 lb (103.9 kg)  03/08/17 228 lb (103.4 kg)  02/16/17 229 lb (103.9 kg)   GERD: patient takes Omeprazole 1 tablet every day. Symptoms are controlled on this medication.  Patient Active Problem List   Diagnosis Date Noted  . Ventral hernia 01/14/2016  . Asthma with allergic rhinitis 06/06/2015  . At risk for osteoporosis 06/06/2015  . Benign prostatic hypertrophy without urinary obstruction 06/06/2015  . Diabetes mellitus, type 2 ([place]) 06/06/2015  . Essential (primary) hypertension 06/06/2015  . Gastro-esophageal reflux disease without esophagitis 06/06/2015  . HLD (hyperlipidemia) 06/06/2015  . Failure of erection 06/06/2015  . Snores 06/06/2015  . Testicular hypofunction 06/06/2015  . Essential hypertension 11/02/2007  . SINUSITIS CHRONIC, UNSPECIFIED 11/02/2007  . ALLERGIC RHINITIS 11/02/2007    Past Medical History:  Diagnosis Date  . Asthma   . [place] ([place]   . COPD (chronic obstructive pulmonary disease) ([place])   . Diabetes mellitus without complication ([place])   . GERD (gastroesophageal reflux disease)   . Hypertension   . Lipids blood increased   . Multiple allergies   . Restless leg syndrome   . Rhinitis, allergic     Social History   Social History  . Marital status: Married    Spouse name: N/A  . Number of children: N/A  . Years of education: N/A   Occupational History  . Not on file.   Social History Main Topics  . Smoking status: Never Smoker  . Smokeless tobacco: Never Used  .  Alcohol use Yes     Comment: "about every day" " Doctor told me to have 2 drinks a night"  . Drug use: No  . Sexual activity: Not on file   Other Topics Concern  . Not on file   Social History Narrative  . No narrative on file    Outpatient Encounter Prescriptions as of 04/06/2017  Medication Sig Note  . albuterol (VENTOLIN HFA) 108 (90 Base) MCG/ACT inhaler Inhale 1-2 puffs into the lungs every 6 (six) hours as needed for wheezing or shortness of breath.   . Aspirin 81 MG EC tablet Take by mouth. Reported on 01/14/2016 06/06/2015: Received from: [facility]  . carvedilol (COREG) 25 MG tablet Take 1 tablet (25 mg total) by mouth 2 (two) times daily.   . fluticasone (FLONASE) 50 MCG/ACT nasal spray Place 50 sprays into the nose daily.   . hydrochlorothiazide (HYDRODIURIL) 25 MG tablet Take 1 tablet by mouth  daily   . losartan (COZAAR) 100 MG tablet Take 1 tablet (100 mg total) by mouth daily.   . metFORMIN (GLUCOPHAGE) 1000 MG tablet Take 1 tablet (1,000 mg total) by mouth 2 (two) times daily.   . montelukast (SINGULAIR) 10 MG tablet Take by mouth. 06/06/2015: Received from: [facility]  . MULTIPLE VITAMIN PO Take by mouth. 06/06/2015: Received from: [facility]  . omeprazole (PRILOSEC) 20 MG capsule Take 1 capsule (20 mg total) by mouth daily.   . [DISCONTINUED] amoxicillin-clavulanate (AUGMENTIN)  875-125 MG tablet Take 1 tablet by mouth 2 (two) times daily. Take with food   . [DISCONTINUED] budesonide (PULMICORT) 0.5 MG/2ML nebulizer solution Inhale into the lungs. 01/14/2016: Received from: [facility]  . [DISCONTINUED] celecoxib (CELEBREX) 400 MG capsule Take 1 capsule (400 mg total) by mouth daily after breakfast. Do not take w/other antiinflamatory (Patient not taking: Reported on 12/29/2016)   . [DISCONTINUED] DUEXIS 800-26.6 MG TABS Take 1 tablet by mouth 3 (three) times daily. 11/03/2016: Received from: External Pharmacy  Received Sig: TAKE ONE TABLET BY MOUTH THREE TIMES DAILY  . [DISCONTINUED] Flaxseed, Linseed, (FLAX SEED OIL) 1000 MG CAPS 1 daily (Patient not taking: Reported on 03/08/2017)   . [DISCONTINUED] Fluticasone-Salmeterol (ADVAIR DISKUS) 250-50 MCG/DOSE AEPB Inhale 1 puff into the lungs 2 (two) times daily.   . [DISCONTINUED] metaxalone (SKELAXIN) 800 MG tablet Take 1 tablet (800 mg total) by mouth 3 (three) times daily. (Patient not taking: Reported on 12/29/2016)   . [DISCONTINUED] mirabegron ER (MYRBETRIQ) 25 MG TB24 tablet Take 1 tablet (25 mg total) by mouth daily. (Patient not taking: Reported on 02/16/2017)   . [DISCONTINUED] Omega-3 Fatty Acids (FISH OIL) 1000 MG CAPS 1 to 2 daily (Patient not taking: Reported on 12/29/2016)   . [DISCONTINUED] sildenafil (VIAGRA) 100 MG tablet Take by mouth. 06/06/2015: Medication taken as needed.  Received from: [facility]  . [DISCONTINUED] tamsulosin (FLOMAX) 0.4 MG CAPS capsule Take 1 capsule by mouth  every day (Patient not taking: Reported on 02/16/2017)   . [DISCONTINUED] valACYclovir (VALTREX) 1000 MG tablet Take 1 tablet (1,000 mg total) by mouth 2 (two) times daily. Above standard sig Please  do Take 2 tablets by mouth for  2 doses. (Patient not taking: Reported on 03/08/2017)    No facility-administered encounter medications on file as of 04/06/2017.     Allergies  Allergen Reactions  . Amlodipine Swelling    Edema-resolved after stopping this medication    Review of Systems  Constitutional: Negative.   Respiratory: Negative.   Cardiovascular: Negative.   Gastrointestinal: Negative.        Controlled on the medication.  Genitourinary: Positive for frequency and urgency.  Musculoskeletal: Negative.   Neurological: Negative.     Objective:  BP (!) 116/58   Pulse 76   Temp 97.6 F (36.4 C)   Resp 18   Wt 229 lb (103.9 kg)   BMI 29.40 kg/m   Physical Exam  Constitutional: He is oriented to person, place, and time and  well-developed, well-nourished, and in no distress.  HENT:  Head: Normocephalic and atraumatic.  Eyes: Conjunctivae are normal. Pupils are equal, round, and reactive to light.  Neck: Normal range of motion. Neck supple.  Cardiovascular: Normal rate, regular rhythm, normal heart sounds and intact distal pulses.  Exam reveals no gallop.   No murmur heard. Pulmonary/Chest: Effort normal and breath sounds normal. No respiratory distress. He has no wheezes.  Neurological: He is alert and oriented to person, place, and time.  Psychiatric: Mood, memory, affect and judgment normal.   Assessment and Plan :  1. Hyperglycemia A1C 5.9-better. Continue current regimen and work on habit. - POCT HgB A1C - POCT UA - Microalbumin  2. Essential hypertension Stable. Continue current medication.  3. Gastro-esophageal reflux disease without esophagitis Stable. Discussed possible risks of taking Omeprazole long term. Advised patient to try and decrease use of Omeprazole and start taking 1 every other day and follow up on the next visit on this regimen. If  symptoms become uncontrolled go back to 1 daily routine.  4. Other hyperlipidemia  5. Asthma with allergic rhinitis, unspecified asthma severity, uncomplicated 6. Hypogonadism Will re check level today, patient use to get treatment for this with Dr Rogers Blocker and states he did not feel any different. Discussed options for this and risks vs benefits of treating this.  HPI, Exam and A&P transcribed by Theressa Millard, RMA under direction and in the presence of Miguel Aschoff, MD. I have done the exam and reviewed the chart and it is accurate to the best of my knowledge. Development worker, community has been used and  any errors in dictation or transcription are unintentional. Miguel Aschoff M.D. [facility]

## 2017-04-06 NOTE — Patient Instructions (Signed)
try and decrease use of Omeprazole and start taking 1 every other day and follow up on the next visit on this regimen. If symptoms become uncontrolled go back to 1 daily routine.

## 2017-04-11 DIAGNOSIS — I10 Essential (primary) hypertension: Secondary | ICD-10-CM | POA: Diagnosis not present

## 2017-04-11 DIAGNOSIS — E784 Other hyperlipidemia: Secondary | ICD-10-CM | POA: Diagnosis not present

## 2017-04-12 ENCOUNTER — Telehealth: Payer: Self-pay

## 2017-04-12 LAB — COMPREHENSIVE METABOLIC PANEL
A/G RATIO: 1.7 (ref 1.2–2.2)
ALT: 31 IU/L (ref 0–44)
AST: 28 IU/L (ref 0–40)
Albumin: 4.5 g/dL (ref 3.6–4.8)
Alkaline Phosphatase: 77 IU/L (ref 39–117)
BUN/Creatinine Ratio: 15 (ref 10–24)
BUN: 17 mg/dL (ref 8–27)
Bilirubin Total: 0.4 mg/dL (ref 0.0–1.2)
CALCIUM: 9.9 mg/dL (ref 8.6–10.2)
CO2: 29 mmol/L (ref 18–29)
Chloride: 95 mmol/L — ABNORMAL LOW (ref 96–106)
Creatinine, Ser: 1.11 mg/dL (ref 0.76–1.27)
GFR calc Af Amer: 79 mL/min/{1.73_m2} (ref >59)
GFR, EST NON AFRICAN AMERICAN: 68 mL/min/{1.73_m2} (ref >59)
GLUCOSE: 135 mg/dL — AB (ref 65–99)
Globulin, Total: 2.7 g/dL (ref 1.5–4.5)
POTASSIUM: 4.9 mmol/L (ref 3.5–5.2)
Sodium: 139 mmol/L (ref 134–144)
Total Protein: 7.2 g/dL (ref 6.0–8.5)

## 2017-04-12 LAB — CBC WITH DIFFERENTIAL/PLATELET
BASOS ABS: 0 10*3/uL (ref 0.0–0.2)
Basos: 1 %
EOS (ABSOLUTE): 0.3 10*3/uL (ref 0.0–0.4)
Eos: 5 %
Hematocrit: 43.7 % (ref 37.5–51.0)
Hemoglobin: 14.6 g/dL (ref 13.0–17.7)
Immature Grans (Abs): 0 10*3/uL (ref 0.0–0.1)
Immature Granulocytes: 0 %
LYMPHS: 32 %
Lymphocytes Absolute: 1.9 10*3/uL (ref 0.7–3.1)
MCH: 33.2 pg — ABNORMAL HIGH (ref 26.6–33.0)
MCHC: 33.4 g/dL (ref 31.5–35.7)
MCV: 99 fL — ABNORMAL HIGH (ref 79–97)
Monocytes Absolute: 0.4 10*3/uL (ref 0.1–0.9)
Monocytes: 6 %
Neutrophils Absolute: 3.5 10*3/uL (ref 1.4–7.0)
Neutrophils: 56 %
PLATELETS: 278 10*3/uL (ref 150–379)
RBC: 4.4 x10E6/uL (ref 4.14–5.80)
RDW: 14 % (ref 12.3–15.4)
WBC: 6.1 10*3/uL (ref 3.4–10.8)

## 2017-04-12 LAB — LIPID PANEL WITH LDL/HDL RATIO
Cholesterol, Total: 181 mg/dL (ref 100–199)
HDL: 36 mg/dL — AB (ref >39)
LDL Calculated: 93 mg/dL (ref 0–99)
LDL/HDL RATIO: 2.6 ratio (ref 0.0–3.6)
TRIGLYCERIDES: 261 mg/dL — AB (ref 0–149)
VLDL Cholesterol Cal: 52 mg/dL — ABNORMAL HIGH (ref 5–40)

## 2017-04-12 LAB — TSH: TSH: 3.23 u[IU]/mL (ref 0.450–4.500)

## 2017-04-12 LAB — TESTOSTERONE: Testosterone: 271 ng/dL (ref 264–916)

## 2017-04-12 NOTE — Telephone Encounter (Signed)
-----   Message from Jerrol Banana., MD sent at 04/12/2017  9:20 AM EDT ----- Labs OK--testosterone low normal.

## 2017-04-12 NOTE — Telephone Encounter (Signed)
Advised pt of lab results. Pt verbally acknowledges understanding. Akeela Busk Drozdowski, CMA   

## 2017-04-17 LAB — POCT UA - MICROALBUMIN: MICROALBUMIN (UR) POC: 20 mg/L

## 2017-04-17 LAB — POCT GLYCOSYLATED HEMOGLOBIN (HGB A1C): Hemoglobin A1C: 5.9

## 2017-05-26 ENCOUNTER — Ambulatory Visit: Payer: Self-pay | Admitting: Medical

## 2017-05-26 VITALS — BP 160/80 | HR 74 | Temp 99.0°F | Resp 18 | Ht 74.0 in | Wt 225.0 lb

## 2017-05-26 DIAGNOSIS — R062 Wheezing: Secondary | ICD-10-CM

## 2017-05-26 DIAGNOSIS — J069 Acute upper respiratory infection, unspecified: Secondary | ICD-10-CM

## 2017-05-26 DIAGNOSIS — J45909 Unspecified asthma, uncomplicated: Secondary | ICD-10-CM

## 2017-05-26 MED ORDER — ALBUTEROL SULFATE HFA 108 (90 BASE) MCG/ACT IN AERS: 1.0000 | INHALATION_SPRAY | Freq: Four times a day (QID) | RESPIRATORY_TRACT | 1 refills | Status: DC | PRN

## 2017-05-26 MED ORDER — IPRATROPIUM-ALBUTEROL 0.5-2.5 (3) MG/3ML IN SOLN: 3.0000 mL | Freq: Four times a day (QID) | RESPIRATORY_TRACT | Status: DC

## 2017-05-26 MED ORDER — AZITHROMYCIN 250 MG PO TABS: ORAL_TABLET | ORAL | 0 refills | Status: DC

## 2017-05-26 NOTE — Progress Notes (Signed)
   Subjective:    Patient ID: Brian Macdonald, male    DOB: 02/17/1951, 66 y.o.   MRN: [id number]  HPI  66 yo male with 2 Days cough productive yellow / green.Wheezing at night .  Stressed about work. He was on vacation last week and he says he still hasn't caught up on his work. Had used his  Ventolin MDI today 3 times with minimal relief. Would like to see if he can get a nebulizer treatment.   Review of Systems  Constitutional: Negative for chills, fatigue and fever.  HENT: Negative for congestion, ear pain and sore throat.   Eyes: Negative for discharge.  Respiratory: Positive for cough and wheezing. Negative for shortness of breath.   Cardiovascular: Negative for chest pain.  Gastrointestinal: Negative for abdominal pain.  Endocrine: Negative for cold intolerance and heat intolerance.  Genitourinary: Negative for hematuria.  Musculoskeletal: Negative for back pain.  Skin: Negative for rash.  Allergic/Immunologic: Negative for environmental allergies and food allergies.  Neurological: Negative for dizziness and syncope.  spring allergies.     Objective:   Physical Exam  Constitutional: He is oriented to person, place, and time. He appears well-developed and well-nourished.  HENT:  Head: Normocephalic and atraumatic.  Right Ear: External ear normal.  Left Ear: External ear normal.  Mouth/Throat: Oropharynx is clear and moist.  Eyes: Conjunctivae and EOM are normal. Pupils are equal, round, and reactive to light.  Neck: Normal range of motion. Neck supple.  Cardiovascular: Normal rate, regular rhythm and normal heart sounds.  Exam reveals no gallop and no friction rub.   No murmur heard. Pulmonary/Chest: He has wheezes.  Musculoskeletal: Normal range of motion.  Neurological: He is alert and oriented to person, place, and time.  Skin: Skin is warm and dry.  Psychiatric: He has a normal mood and affect. His behavior is normal. Judgment and thought content normal.  Nursing  note and vitals reviewed.    Duoneb given  in clinic. S/P all wheezing resolved. Patient feeling much better.     Assessment & Plan:  Upper Respiratory Infection e-prescibed Z-pak 2 tablets by mouth today then one tablet by mouth days  2-5. Take with food  #6 , no refills. He has  Prednisone at home  10 mg , recommended he take 6 tablets today by mouth,  5 tablets tomorrow and one less each day thereafter for a total of 6 days.  Ventolin MDI 2 puffs every 6 hours as needed for cough, shortness of breath or wheezing #1 1 refill. Given Duoneb treatment in clinic which he tolerated well.  Patient to return to clinic Monday or Tuesday if not improving.  He verbalizes understanding and has no questions at discharge.

## 2017-06-21 ENCOUNTER — Other Ambulatory Visit: Payer: Self-pay | Admitting: *Deleted

## 2017-06-21 NOTE — Patient Outreach (Signed)
[facility] [facility]) Care Management  06/21/2017  Brian Macdonald 09/30/1951 [id number]   HTA High Risk Screen  Once introduction completed and name was verified RN inquired if this was a good time to talk. Pt indicated it was not and requested a call back in 2 weeks. Will attempt to follow up accordingly.  Raina Mina, RN Care Management Coordinator [place] Office [phone]

## 2017-06-29 ENCOUNTER — Other Ambulatory Visit: Payer: Self-pay

## 2017-06-29 ENCOUNTER — Telehealth: Payer: Self-pay | Admitting: Family Medicine

## 2017-06-29 MED ORDER — FLUTICASONE PROPIONATE 50 MCG/ACT NA SUSP: 1.0000 | Freq: Every day | NASAL | 3 refills | Status: DC

## 2017-06-29 NOTE — Telephone Encounter (Signed)
Pt would like Elena to call him back.  He said it was in regards to a medication that he needed that was not originally prescribed by Dr. Rosanna Randy.  Didn't really care to give anymore info.  Wanted Jiles Garter to call.  Thanks [employer]

## 2017-06-29 NOTE — Telephone Encounter (Signed)
Done  ED 

## 2017-06-29 NOTE — Telephone Encounter (Signed)
Pt requested Rx for Flonase sent to [facility].

## 2017-07-06 ENCOUNTER — Encounter: Payer: Self-pay | Admitting: Adult Health

## 2017-07-06 ENCOUNTER — Ambulatory Visit: Payer: Self-pay | Admitting: Adult Health

## 2017-07-06 VITALS — BP 134/76 | HR 71 | Temp 98.5°F

## 2017-07-06 DIAGNOSIS — J0181 Other acute recurrent sinusitis: Secondary | ICD-10-CM

## 2017-07-06 MED ORDER — AMOXICILLIN-POT CLAVULANATE 875-125 MG PO TABS
1.0000 | ORAL_TABLET | Freq: Two times a day (BID) | ORAL | 0 refills | Status: AC
Start: 2017-07-06 — End: 2017-07-16

## 2017-07-06 NOTE — Progress Notes (Signed)
Subjective:     Patient ID: Brian Macdonald, male   DOB: [birth year], 66 y.o.   MRN: [id number]  HPI  Patient is a 66 year old male in no  acute distress who presents to the clinic with complaints of congestion in his  throat and coughing that started on Sunday 07/02/17. He reports increased sneezing over the weekend. He denies any difficulty swallowing.   He saw PA in office on 05/26/17 and was given Prednisone and Azithromycin, he reports he felt as if symptoms were clearing but have now returned.   Denies fevers's chills nausea vomiting.  No increased shortness breath with rest or activity. He denies any chest pain, or wheezing.   Never a smoker.  He has allergies and is using Fluticasone nasal spray. He is not taking any Singulair currently per his report and uses mostly in  the spring when his allergies flare.   He denies any recent exposures or contacts to illness.  He denies any upper or lower extremity edema.   Blood pressure 134/76, pulse 71, temperature 98.5 F (36.9 C), temperature source Tympanic, SpO2 98 %.  Allergies  Allergen Reactions  . Amlodipine Swelling    Edema-resolved after stopping this medication    Review of Systems  Constitutional: Negative for activity change, appetite change, chills, diaphoresis, fatigue, fever and unexpected weight change.  HENT: Positive for congestion, postnasal drip, rhinorrhea, sinus pressure, sneezing and sore throat. Negative for dental problem, drooling, ear discharge, ear pain, facial swelling, hearing loss, mouth sores, nosebleeds, sinus pain, tinnitus, trouble swallowing and voice change.   Eyes: Negative for photophobia, pain, discharge, redness, itching and visual disturbance.  Respiratory: Negative for apnea, cough, choking, chest tightness, shortness of breath, wheezing and stridor.   Cardiovascular: Negative for chest pain, palpitations and leg swelling.  Gastrointestinal: Negative.   Genitourinary: Negative.  Negative for  difficulty urinating.  Musculoskeletal: Negative.   Skin: Negative.   Allergic/Immunologic: Positive for environmental allergies.  Neurological: Negative.   Hematological: Negative.   Psychiatric/Behavioral: Negative.        Objective:   Physical Exam  Constitutional: Vital signs are normal. He appears well-developed and well-nourished.  Non-toxic appearance. He does not have a sickly appearance. He does not appear ill. No distress.  HENT:  Head: Normocephalic and atraumatic.  Right Ear: Hearing, tympanic membrane, external ear and ear canal normal. No drainage or swelling. Tympanic membrane is not injected and not perforated.  Left Ear: Hearing, tympanic membrane, external ear and ear canal normal. No drainage or swelling. Tympanic membrane is not injected and not perforated.  Nose: Mucosal edema and rhinorrhea present. No sinus tenderness. Right sinus exhibits no maxillary sinus tenderness and no frontal sinus tenderness. Left sinus exhibits maxillary sinus tenderness and frontal sinus tenderness.  Mouth/Throat: Uvula is midline and mucous membranes are normal. No oral lesions. No uvula swelling. Posterior oropharyngeal erythema present. No oropharyngeal exudate, posterior oropharyngeal edema or tonsillar abscesses.  Bilateral tonsils with erythema and  + 1 in size  Eyes: Pupils are equal, round, and reactive to light. Conjunctivae, EOM and lids are normal.  Skin: He is not diaphoretic.     Assessment:  1. Sinusitis, Acute Bacterial    Plan:     1. Will consider chest x ray if no improvement though cough appears to be postnasal drip induced. Last chest x-ray 2012.  2. Augmentin E Prescribed as below/  Meds ordered this encounter  Medications  . amoxicillin-clavulanate (AUGMENTIN) 875-125 MG tablet    Sig:  Take 1 tablet by mouth 2 (two) times daily.    Dispense:  20 tablet    Refill:  0     He has refill on his inhaler and is not using very often per his report less than once  per day. This is his normal per his report.   3. Return to clinic at any time  if any new symptoms change, worsen or do not improve. Symptoms should improve  within 72 hours and if not improving you should call for an appointment at the clinic is closed or be seen in urgent care/ED if clinic is closed. Follow up with your PCP, Patient verbalized above understanding of all instructions and denies any questions currently.

## 2017-07-07 ENCOUNTER — Encounter: Payer: Self-pay | Admitting: *Deleted

## 2017-07-07 ENCOUNTER — Other Ambulatory Visit: Payer: Self-pay | Admitting: *Deleted

## 2017-07-07 NOTE — Patient Instructions (Signed)

## 2017-07-07 NOTE — Patient Outreach (Signed)
[place] [facility]) Care Management  07/07/2017  DAESHON GRAMMATICO [birth year] [id number]   HTA Screening  RN spoke with pt and confirmed identifiers however pt states he has recently moved for the noted address but confirmed it was his previous address until 2 weeks ago. Note RN spoke with pt 2 weeks ago and informed to call back today to complete the screening. Screening completed as pt reports no needs at this time stating he continue to work and is very active with his health.   Raina Mina, RN Care Management Coordinator [place] Office [phone]

## 2017-08-31 ENCOUNTER — Ambulatory Visit (INDEPENDENT_AMBULATORY_CARE_PROVIDER_SITE_OTHER): Payer: PPO

## 2017-08-31 VITALS — BP 144/80 | HR 70 | Temp 97.3°F | Resp 16 | Ht 74.0 in | Wt 231.0 lb

## 2017-08-31 DIAGNOSIS — Z0000 Encounter for general adult medical examination without abnormal findings: Secondary | ICD-10-CM

## 2017-08-31 DIAGNOSIS — Z23 Encounter for immunization: Secondary | ICD-10-CM | POA: Diagnosis not present

## 2017-08-31 NOTE — Patient Instructions (Signed)
Brian Macdonald , Thank you for taking time to come for your Medicare Wellness Visit. I appreciate your ongoing commitment to your health goals. Please review the following plan we discussed and let me know if I can assist you in the future.   Screening recommendations/referrals: Colonoscopy: completed Recommended yearly ophthalmology/optometry visit for glaucoma screening and checkup Recommended yearly dental visit for hygiene and checkup  Vaccinations: Influenza vaccine: declined. Pt to provide copy after completion with employer Pneumococcal vaccine: given today Tdap vaccine: up to date Shingles vaccine: completed    Advanced directives: Advance directive discussed with you today. I have provided a copy for you to complete at home and have notarized. Once this is complete please bring a copy in to our office so we can scan it into your chart.  Conditions/risks identified: Continue exercising daily; Fall risk prevention discussed  Next appointment: Scheduled to see Dr. Rosanna Randy on 09/06/17 @ 8:30am. Follow up annual wellness exam in one year.  Preventive Care 68 Years and Older, Male Preventive care refers to lifestyle choices and visits with your health care provider that can promote health and wellness. What does preventive care include?  A yearly physical exam. This is also called an annual well check.  Dental exams once or twice a year.  Routine eye exams. Ask your health care provider how often you should have your eyes checked.  Personal lifestyle choices, including:  Daily care of your teeth and gums.  Regular physical activity.  Eating a healthy diet.  Avoiding tobacco and drug use.  Limiting alcohol use.  Practicing safe sex.  Taking low doses of aspirin every day.  Taking vitamin and mineral supplements as recommended by your health care provider. What happens during an annual well check? The services and screenings done by your health care provider during your  annual well check will depend on your age, overall health, lifestyle risk factors, and family history of disease. Counseling  Your health care provider may ask you questions about your:  Alcohol use.  Tobacco use.  Drug use.  Emotional well-being.  Home and relationship well-being.  Sexual activity.  Eating habits.  History of falls.  Memory and ability to understand (cognition).  Work and work Statistician. Screening  You may have the following tests or measurements:  Height, weight, and BMI.  Blood pressure.  Lipid and cholesterol levels. These may be checked every 5 years, or more frequently if you are over 65 years old.  Skin check.  Lung cancer screening. You may have this screening every year starting at age 44 if you have a 30-pack-year history of smoking and currently smoke or have quit within the past 15 years.  Fecal occult blood test (FOBT) of the stool. You may have this test every year starting at age 32.  Flexible sigmoidoscopy or colonoscopy. You may have a sigmoidoscopy every 5 years or a colonoscopy every 10 years starting at age 72.  Prostate cancer screening. Recommendations will vary depending on your family history and other risks.  Hepatitis C blood test.  Hepatitis B blood test.  Sexually transmitted disease (STD) testing.  Diabetes screening. This is done by checking your blood sugar (glucose) after you have not eaten for a while (fasting). You may have this done every 1-3 years.  Abdominal aortic aneurysm (AAA) screening. You may need this if you are a current or former smoker.  Osteoporosis. You may be screened starting at age 28 if you are at high risk. Talk with your health  care provider about your test results, treatment options, and if necessary, the need for more tests. Vaccines  Your health care provider may recommend certain vaccines, such as:  Influenza vaccine. This is recommended every year.  Tetanus, diphtheria, and  acellular pertussis (Tdap, Td) vaccine. You may need a Td booster every 10 years.  Zoster vaccine. You may need this after age 16.  Pneumococcal 13-valent conjugate (PCV13) vaccine. One dose is recommended after age 35.  Pneumococcal polysaccharide (PPSV23) vaccine. One dose is recommended after age 65. Talk to your health care provider about which screenings and vaccines you need and how often you need them. This information is not intended to replace advice given to you by your health care provider. Make sure you discuss any questions you have with your health care provider. Document Released: 12/11/2015 Document Revised: 08/03/2016 Document Reviewed: 09/15/2015 Elsevier Interactive Patient Education  2017 [place] Prevention in the Home Falls can cause injuries. They can happen to people of all ages. There are many things you can do to make your home safe and to help prevent falls. What can I do on the outside of my home?  Regularly fix the edges of walkways and driveways and fix any cracks.  Remove anything that might make you trip as you walk through a door, such as a raised step or threshold.  Trim any bushes or trees on the path to your home.  Use bright outdoor lighting.  Clear any walking paths of anything that might make someone trip, such as rocks or tools.  Regularly check to see if handrails are loose or broken. Make sure that both sides of any steps have handrails.  Any raised decks and porches should have guardrails on the edges.  Have any leaves, snow, or ice cleared regularly.  Use sand or salt on walking paths during winter.  Clean up any spills in your garage right away. This includes oil or grease spills. What can I do in the bathroom?  Use night lights.  Install grab bars by the toilet and in the tub and shower. Do not use towel bars as grab bars.  Use non-skid mats or decals in the tub or shower.  If you need to sit down in the shower, use  a plastic, non-slip stool.  Keep the floor dry. Clean up any water that spills on the floor as soon as it happens.  Remove soap buildup in the tub or shower regularly.  Attach bath mats securely with double-sided non-slip rug tape.  Do not have throw rugs and other things on the floor that can make you trip. What can I do in the bedroom?  Use night lights.  Make sure that you have a light by your bed that is easy to reach.  Do not use any sheets or blankets that are too big for your bed. They should not hang down onto the floor.  Have a firm chair that has side arms. You can use this for support while you get dressed.  Do not have throw rugs and other things on the floor that can make you trip. What can I do in the kitchen?  Clean up any spills right away.  Avoid walking on wet floors.  Keep items that you use a lot in easy-to-reach places.  If you need to reach something above you, use a strong step stool that has a grab bar.  Keep electrical cords out of the way.  Do not use floor  polish or wax that makes floors slippery. If you must use wax, use non-skid floor wax.  Do not have throw rugs and other things on the floor that can make you trip. What can I do with my stairs?  Do not leave any items on the stairs.  Make sure that there are handrails on both sides of the stairs and use them. Fix handrails that are broken or loose. Make sure that handrails are as long as the stairways.  Check any carpeting to make sure that it is firmly attached to the stairs. Fix any carpet that is loose or worn.  Avoid having throw rugs at the top or bottom of the stairs. If you do have throw rugs, attach them to the floor with carpet tape.  Make sure that you have a light switch at the top of the stairs and the bottom of the stairs. If you do not have them, ask someone to add them for you. What else can I do to help prevent falls?  Wear shoes that:  Do not have high heels.  Have  rubber bottoms.  Are comfortable and fit you well.  Are closed at the toe. Do not wear sandals.  If you use a stepladder:  Make sure that it is fully opened. Do not climb a closed stepladder.  Make sure that both sides of the stepladder are locked into place.  Ask someone to hold it for you, if possible.  Clearly mark and make sure that you can see:  Any grab bars or handrails.  First and last steps.  Where the edge of each step is.  Use tools that help you move around (mobility aids) if they are needed. These include:  Canes.  Walkers.  Scooters.  Crutches.  Turn on the lights when you go into a dark area. Replace any light bulbs as soon as they burn out.  Set up your furniture so you have a clear path. Avoid moving your furniture around.  If any of your floors are uneven, fix them.  If there are any pets around you, be aware of where they are.  Review your medicines with your doctor. Some medicines can make you feel dizzy. This can increase your chance of falling. Ask your doctor what other things that you can do to help prevent falls. This information is not intended to replace advice given to you by your health care provider. Make sure you discuss any questions you have with your health care provider. Document Released: 09/10/2009 Document Revised: 04/21/2016 Document Reviewed: 12/19/2014 Elsevier Interactive Patient Education  2017 [employer].

## 2017-08-31 NOTE — Progress Notes (Signed)
Subjective:   Brian Macdonald is a 66 y.o. male who presents for an Initial Medicare Annual Wellness Visit.  Review of Systems  N/A Cardiac Risk Factors include: advanced age (>53men, >78 women);male gender;diabetes mellitus;dyslipidemia;hypertension    Objective:    Today's Vitals   08/31/17 1351  BP: (!) 144/80  Pulse: 70  Resp: 16  Temp: (!) 97.3 F (36.3 C)  TempSrc: Oral  Weight: 231 lb (104.8 kg)  Height: 6\' 2"  (1.88 m)   Body mass index is 29.66 kg/m.  Current Medications (verified) Outpatient Encounter Prescriptions as of 08/31/2017  Medication Sig  . albuterol (VENTOLIN HFA) 108 (90 Base) MCG/ACT inhaler Inhale 1-2 puffs into the lungs every 6 (six) hours as needed for wheezing or shortness of breath.  . Aspirin 81 MG EC tablet Take by mouth. Reported on 01/14/2016  . carvedilol (COREG) 25 MG tablet Take 1 tablet (25 mg total) by mouth 2 (two) times daily.  . fluticasone (FLONASE) 50 MCG/ACT nasal spray Place 1 spray into both nostrils daily.  . hydrochlorothiazide (HYDRODIURIL) 25 MG tablet Take 1 tablet by mouth  daily  . losartan (COZAAR) 100 MG tablet Take 1 tablet (100 mg total) by mouth daily.  . metFORMIN (GLUCOPHAGE) 1000 MG tablet Take 1 tablet (1,000 mg total) by mouth 2 (two) times daily.  . montelukast (SINGULAIR) 10 MG tablet Take by mouth.  . MULTIPLE VITAMIN PO Take by mouth.  Marland Kitchen omeprazole (PRILOSEC) 20 MG capsule Take 1 capsule (20 mg total) by mouth daily.  . [DISCONTINUED] ipratropium-albuterol (DUONEB) 0.5-2.5 (3) MG/3ML nebulizer solution 3 mL    No facility-administered encounter medications on file as of 08/31/2017.     Allergies (verified) Amlodipine   History: Past Medical History:  Diagnosis Date  . Asthma   . Cancer ([place])   . COPD (chronic obstructive pulmonary disease) ([place])   . Diabetes mellitus without complication ([place])   . GERD (gastroesophageal reflux disease)   . Hypertension   . Lipids blood increased   . Multiple  allergies   . Restless leg syndrome   . Rhinitis, allergic    Past Surgical History:  Procedure Laterality Date  . COLONOSCOPY WITH PROPOFOL N/A 09/11/2015   Procedure: COLONOSCOPY WITH PROPOFOL;  Surgeon: Brian Class, MD;  Location: [facility] ENDOSCOPY;  Service: Endoscopy;  Laterality: N/A;  . NASAL SINUS SURGERY  2009   Due to sinus infection, breathing problems and passage was " messed up"  . SKIN CANCER EXCISION  12/2011   basal cell carcinoma  . VASECTOMY  1985  . VASECTOMY     Family History  Problem Relation Age of Onset  . Diabetes Mother   . Hypertension Mother   . Prostate cancer Father   . Asthma Sister   . Heart disease Brother   . Bladder Cancer Neg Hx   . Kidney cancer Neg Hx    Social History   Occupational History  . Not on file.   Social History Main Topics  . Smoking status: Never Smoker  . Smokeless tobacco: Never Used  . Alcohol use Yes     Comment: "about every day" " Doctor told me to have 2 drinks a night"  . Drug use: No  . Sexual activity: Not on file   Tobacco Counseling Counseling given: Not Answered   Activities of Daily Living In your present state of health, do you have any difficulty performing the following activities: 08/31/2017 11/03/2016  Hearing? N N  Vision? N N  Difficulty concentrating or making decisions? N N  Walking or climbing stairs? N N  Dressing or bathing? N N  Doing errands, shopping? N N  Preparing Food and eating ? N -  Using the Toilet? N -  In the past six months, have you accidently leaked urine? N -  Do you have problems with loss of bowel control? N -  Managing your Medications? N -  Managing your Finances? N -  Housekeeping or managing your Housekeeping? N -  Some recent data might be hidden    Immunizations and Health Maintenance Immunization History  Administered Date(s) Administered  . Pneumococcal Conjugate-13 02/04/2015  . Pneumococcal Polysaccharide-23 07/25/2011, 08/31/2017  . Td  04/15/2004  . Tdap 07/25/2011  . Zoster 10/12/2011   There are no preventive care reminders to display for this patient.  Patient Care Team: Brian Macdonald., MD as PCP - General (Family Medicine)  Indicate any recent Medical Services you may have received from other than [facility] providers in the past year (date may be approximate).    Assessment:   This is a routine wellness examination for Brian Macdonald.   Hearing/Vision screen Vision Screening Comments: Sees Brian Macdonald [facility] for annual eye exams  Dietary issues and exercise activities discussed: Current Exercise Habits: Structured exercise Macdonald, Type of exercise: walking, Time (Minutes): 45, Frequency (Times/Week): 5, Weekly Exercise (Minutes/Week): 225, Intensity: Mild, Exercise limited by: None identified  Goals    . Exercise 150 minutes per week (moderate activity)          Continue exercising daily      Depression Screen PHQ 2/9 Scores 08/31/2017 07/07/2017 11/03/2016 09/02/2015  PHQ - 2 Score 0 0 0 0    Fall Risk Fall Risk  08/31/2017 11/03/2016 09/02/2015  Falls in the past year? No No No    Cognitive Function:     6CIT Screen 08/31/2017  What Year? 0 points  What month? 0 points  What time? 0 points  Count back from 20 0 points  Months in reverse 0 points  Repeat phrase 2 points  Total Score 2    Screening Tests Health Maintenance  Topic Date Due  . INFLUENZA VACCINE  11/03/2017 (Originally 06/28/2017)  . HEMOGLOBIN A1C  10/18/2017  . FOOT EXAM  11/03/2017  . OPHTHALMOLOGY EXAM  01/25/2018  . TETANUS/TDAP  07/24/2021  . COLONOSCOPY  09/10/2025  . Hepatitis C Screening  Completed  . PNA vac Low Risk Adult  Completed        Plan:   I have personally reviewed and addressed the Medicare Annual Wellness questionnaire and have noted the following in the patient's chart:  A. Medical and social history B. Use of alcohol, tobacco or illicit drugs  C. Current medications and supplements D. Functional ability and  status E.  Nutritional status F.  Physical activity G. Advance directives H. List of other physicians I.  Hospitalizations, surgeries, and ER visits in previous 12 months J.  [place] such as hearing and vision if needed, cognitive and depression L. Referrals and appointments - none  In addition, I have reviewed and discussed with patient certain preventive protocols, quality metrics, and best practice recommendations. A written personalized care plan for preventive services as well as general preventive health recommendations were provided to patient.  See attached scanned questionnaire for additional information.   Signed,  Aleatha Borer, LPN Nurse Health Advisor   MD Recommendations:

## 2017-09-06 ENCOUNTER — Encounter: Payer: PPO | Admitting: Family Medicine

## 2017-09-06 ENCOUNTER — Ambulatory Visit (INDEPENDENT_AMBULATORY_CARE_PROVIDER_SITE_OTHER): Payer: PPO | Admitting: Family Medicine

## 2017-09-06 VITALS — BP 130/76 | HR 68 | Temp 97.5°F | Resp 16 | Ht 74.0 in | Wt 228.0 lb

## 2017-09-06 DIAGNOSIS — Z0000 Encounter for general adult medical examination without abnormal findings: Secondary | ICD-10-CM

## 2017-09-06 NOTE — Progress Notes (Signed)
Patient: Brian Macdonald, Male    DOB: 1951/05/12, 66 y.o.   MRN: [id number] Visit Date: 09/06/2017  Today's Provider: Wilhemena Durie, MD   Chief Complaint  Patient presents with  . Annual Exam   Subjective:   Brian Macdonald is a 66 y.o. male who presents today for his Subsequent Annual Wellness Visit. He feels well. He reports exercising 5 times week. He reports he is sleeping well.  Immunization History  Administered Date(s) Administered  . Pneumococcal Conjugate-13 02/04/2015  . Pneumococcal Polysaccharide-23 07/25/2011, 08/31/2017  . Td 04/15/2004  . Tdap 07/25/2011  . Zoster 10/12/2011   09/11/15 Colonoscopy, Dr Rein-int and ext hemorrhoids, tubular adenoma, diverticulosis.   Review of Systems  Constitutional: Negative.   HENT: Negative.   Eyes: Negative.   Respiratory: Negative.   Cardiovascular: Negative.   Gastrointestinal: Negative.   Endocrine: Negative.   Genitourinary: Negative.   Musculoskeletal: Negative.   Skin: Negative.   Allergic/Immunologic: Negative.   Neurological: Negative.   Hematological: Negative.   Psychiatric/Behavioral: Negative.     Patient Active Problem List   Diagnosis Date Noted  . Ventral hernia 01/14/2016  . Mild intermittent asthma without complication 05/27/1600  . Asthma with allergic rhinitis 06/06/2015  . At risk for osteoporosis 06/06/2015  . Benign prostatic hypertrophy without urinary obstruction 06/06/2015  . Diabetes mellitus, type 2 ([place]) 06/06/2015  . Essential (primary) hypertension 06/06/2015  . Gastro-esophageal reflux disease without esophagitis 06/06/2015  . HLD (hyperlipidemia) 06/06/2015  . Failure of erection 06/06/2015  . Snores 06/06/2015  . Testicular hypofunction 06/06/2015  . Essential hypertension 11/02/2007  . SINUSITIS CHRONIC, UNSPECIFIED 11/02/2007  . ALLERGIC RHINITIS 11/02/2007    Social History   Social History  . Marital status: Married    Spouse name: N/A  . Number of children:  N/A  . Years of education: N/A   Occupational History  . Not on file.   Social History Main Topics  . Smoking status: Never Smoker  . Smokeless tobacco: Never Used  . Alcohol use Yes     Comment: "about every day" " Doctor told me to have 2 drinks a night"  . Drug use: No  . Sexual activity: Not on file   Other Topics Concern  . Not on file   Social History Narrative  . No narrative on file    Past Surgical History:  Procedure Laterality Date  . COLONOSCOPY WITH PROPOFOL N/A 09/11/2015   Procedure: COLONOSCOPY WITH PROPOFOL;  Surgeon: Josefine Class, MD;  Location: [facility] ENDOSCOPY;  Service: Endoscopy;  Laterality: N/A;  . NASAL SINUS SURGERY  2009   Due to sinus infection, breathing problems and passage was " messed up"  . SKIN CANCER EXCISION  12/2011   basal cell carcinoma  . VASECTOMY  1985  . VASECTOMY      His family history includes Asthma in his sister; Diabetes in his mother; Heart disease in his brother; Hypertension in his mother; Prostate cancer in his father.     Outpatient Encounter Prescriptions as of 09/06/2017  Medication Sig Note  . albuterol (VENTOLIN HFA) 108 (90 Base) MCG/ACT inhaler Inhale 1-2 puffs into the lungs every 6 (six) hours as needed for wheezing or shortness of breath.   . Aspirin 81 MG EC tablet Take by mouth. Reported on 01/14/2016 06/06/2015: Received from: [facility]  . carvedilol (COREG) 25 MG tablet Take 1 tablet (25 mg total) by mouth 2 (two) times daily.   . fluticasone (FLONASE) 50 MCG/ACT nasal  spray Place 1 spray into both nostrils daily.   . hydrochlorothiazide (HYDRODIURIL) 25 MG tablet Take 1 tablet by mouth  daily   . losartan (COZAAR) 100 MG tablet Take 1 tablet (100 mg total) by mouth daily.   . metFORMIN (GLUCOPHAGE) 1000 MG tablet Take 1 tablet (1,000 mg total) by mouth 2 (two) times daily.   . montelukast (SINGULAIR) 10 MG tablet Take by mouth. 06/06/2015: Received from: [facility]   . MULTIPLE VITAMIN PO Take by mouth. 06/06/2015: Received from: [facility]  . omeprazole (PRILOSEC) 20 MG capsule Take 1 capsule (20 mg total) by mouth daily.    No facility-administered encounter medications on file as of 09/06/2017.     Allergies  Allergen Reactions  . Amlodipine Swelling    Edema-resolved after stopping this medication    Patient Care Team: Jerrol Banana., MD as PCP - General (Family Medicine)   Objective:   Vitals:  Vitals:   09/06/17 1415  BP: 130/76  Pulse: 68  Resp: 16  Temp: (!) 97.5 F (36.4 C)  TempSrc: Oral  Weight: 228 lb (103.4 kg)  Height: 6\' 2"  (1.88 m)    Physical Exam  Constitutional: He is oriented to person, place, and time. He appears well-developed and well-nourished.  HENT:  Head: Normocephalic and atraumatic.  Right Ear: External ear normal.  Left Ear: External ear normal.  Nose: Nose normal.  Mouth/Throat: Oropharynx is clear and moist.  Eyes: Pupils are equal, round, and reactive to light. Conjunctivae and EOM are normal.  Neck: Normal range of motion. Neck supple.  Cardiovascular: Normal rate, regular rhythm, normal heart sounds and intact distal pulses.   Pulmonary/Chest: Effort normal and breath sounds normal.  Abdominal: Soft. Bowel sounds are normal.  Genitourinary: Rectum normal, prostate normal and penis normal.  Musculoskeletal: Normal range of motion.  Neurological: He is alert and oriented to person, place, and time.  Skin: Skin is warm and dry.  Psychiatric: He has a normal mood and affect. His behavior is normal. Judgment and thought content normal.    Activities of Daily Living In your present state of health, do you have any difficulty performing the following activities: 08/31/2017 11/03/2016  Hearing? N N  Vision? N N  Difficulty concentrating or making decisions? N N  Walking or climbing stairs? N N  Dressing or bathing? N N  Doing errands, shopping? N N  Preparing Food and  eating ? N -  Using the Toilet? N -  In the past six months, have you accidently leaked urine? N -  Do you have problems with loss of bowel control? N -  Managing your Medications? N -  Managing your Finances? N -  Housekeeping or managing your Housekeeping? N -  Some recent data might be hidden    Fall Risk Assessment Fall Risk  08/31/2017 11/03/2016 09/02/2015  Falls in the past year? No No No     Depression Screen PHQ 2/9 Scores 08/31/2017 07/07/2017 11/03/2016 09/02/2015  PHQ - 2 Score 0 0 0 0      Assessment & Plan:     Annual Wellness Visit  Reviewed patient's Family Medical History Reviewed and updated list of patient's medical providers Assessment of cognitive impairment was done Assessed patient's functional ability Established a written schedule for health screening [place] Completed and Reviewed  Exercise Activities and Dietary recommendations Goals    . Exercise 150 minutes per week (moderate activity)  Continue exercising daily       Immunization History  Administered Date(s) Administered  . Pneumococcal Conjugate-13 02/04/2015  . Pneumococcal Polysaccharide-23 07/25/2011, 08/31/2017  . Td 04/15/2004  . Tdap 07/25/2011  . Zoster 10/12/2011    Health Maintenance  Topic Date Due  . INFLUENZA VACCINE  11/03/2017 (Originally 06/28/2017)  . HEMOGLOBIN A1C  10/18/2017  . FOOT EXAM  11/03/2017  . OPHTHALMOLOGY EXAM  01/25/2018  . TETANUS/TDAP  07/24/2021  . COLONOSCOPY  09/10/2025  . Hepatitis C Screening  Completed  . PNA vac Low Risk Adult  Completed     Discussed health benefits of physical activity, and encouraged him to engage in regular exercise appropriate for his age and condition.   [place] TIIDM--now prediabetes AR/Asthma HTN HLD GERD Colonic Polyps   I have done the exam and reviewed the chart and it is accurate to the best of my knowledge. Risk manager has been used and  any errors in dictation or transcription are unintentional. Miguel Aschoff M.D. [facility]

## 2017-09-21 ENCOUNTER — Ambulatory Visit: Payer: PPO | Admitting: Urology

## 2017-10-06 ENCOUNTER — Other Ambulatory Visit: Payer: Self-pay

## 2017-10-06 DIAGNOSIS — E785 Hyperlipidemia, unspecified: Secondary | ICD-10-CM

## 2017-10-06 DIAGNOSIS — E1122 Type 2 diabetes mellitus with diabetic chronic kidney disease: Secondary | ICD-10-CM | POA: Diagnosis not present

## 2017-10-06 DIAGNOSIS — N181 Chronic kidney disease, stage 1: Secondary | ICD-10-CM

## 2017-10-06 DIAGNOSIS — I10 Essential (primary) hypertension: Secondary | ICD-10-CM

## 2017-10-07 LAB — TSH: TSH: 2.31 u[IU]/mL (ref 0.450–4.500)

## 2017-10-07 LAB — CBC WITH DIFFERENTIAL/PLATELET
BASOS: 0 %
Basophils Absolute: 0 10*3/uL (ref 0.0–0.2)
EOS (ABSOLUTE): 0.2 10*3/uL (ref 0.0–0.4)
EOS: 4 %
HEMATOCRIT: 43.8 % (ref 37.5–51.0)
Hemoglobin: 14.8 g/dL (ref 13.0–17.7)
IMMATURE GRANS (ABS): 0 10*3/uL (ref 0.0–0.1)
IMMATURE GRANULOCYTES: 0 %
LYMPHS: 28 %
Lymphocytes Absolute: 1.5 10*3/uL (ref 0.7–3.1)
MCH: 33.3 pg — ABNORMAL HIGH (ref 26.6–33.0)
MCHC: 33.8 g/dL (ref 31.5–35.7)
MCV: 98 fL — AB (ref 79–97)
Monocytes Absolute: 0.3 10*3/uL (ref 0.1–0.9)
Monocytes: 6 %
NEUTROS PCT: 62 %
Neutrophils Absolute: 3.3 10*3/uL (ref 1.4–7.0)
PLATELETS: 248 10*3/uL (ref 150–379)
RBC: 4.45 x10E6/uL (ref 4.14–5.80)
RDW: 13.1 % (ref 12.3–15.4)
WBC: 5.3 10*3/uL (ref 3.4–10.8)

## 2017-10-07 LAB — LIPID PANEL
CHOLESTEROL TOTAL: 169 mg/dL (ref 100–199)
Chol/HDL Ratio: 4.6 ratio (ref 0.0–5.0)
HDL: 37 mg/dL — ABNORMAL LOW (ref >39)
LDL Calculated: 100 mg/dL — ABNORMAL HIGH (ref 0–99)
TRIGLYCERIDES: 162 mg/dL — AB (ref 0–149)
VLDL Cholesterol Cal: 32 mg/dL (ref 5–40)

## 2017-10-08 LAB — COMPREHENSIVE METABOLIC PANEL
ALBUMIN: 4.9 g/dL — AB (ref 3.6–4.8)
ALK PHOS: 95 IU/L (ref 39–117)
ALT: 40 IU/L (ref 0–44)
AST: 33 IU/L (ref 0–40)
Albumin/Globulin Ratio: 2.3 — ABNORMAL HIGH (ref 1.2–2.2)
BUN / CREAT RATIO: 14 (ref 10–24)
BUN: 12 mg/dL (ref 8–27)
Bilirubin Total: 0.3 mg/dL (ref 0.0–1.2)
CO2: 19 mmol/L — AB (ref 20–29)
CREATININE: 0.86 mg/dL (ref 0.76–1.27)
Calcium: 9.7 mg/dL (ref 8.6–10.2)
Chloride: 95 mmol/L — ABNORMAL LOW (ref 96–106)
GFR calc Af Amer: 103 mL/min/{1.73_m2} (ref >59)
GFR calc non Af Amer: 89 mL/min/{1.73_m2} (ref >59)
GLUCOSE: 136 mg/dL — AB (ref 65–99)
Globulin, Total: 2.1 g/dL (ref 1.5–4.5)
Potassium: 4.6 mmol/L (ref 3.5–5.2)
Sodium: 137 mmol/L (ref 134–144)
TOTAL PROTEIN: 7 g/dL (ref 6.0–8.5)

## 2017-10-12 ENCOUNTER — Ambulatory Visit: Payer: Self-pay | Admitting: Family Medicine

## 2017-10-12 ENCOUNTER — Ambulatory Visit: Payer: PPO | Admitting: Urology

## 2017-10-12 ENCOUNTER — Encounter: Payer: Self-pay | Admitting: Urology

## 2017-10-12 VITALS — BP 138/79 | HR 64 | Ht 74.0 in | Wt 228.8 lb

## 2017-10-12 DIAGNOSIS — N3281 Overactive bladder: Secondary | ICD-10-CM | POA: Diagnosis not present

## 2017-10-12 MED ORDER — TOLTERODINE TARTRATE ER 4 MG PO CP24: 4.0000 mg | ORAL_CAPSULE | Freq: Every day | ORAL | 11 refills | Status: DC

## 2017-10-12 NOTE — Progress Notes (Signed)
10/12/2017 10:46 AM   Brian Macdonald July 22, 1951 [id number]  Referring provider: Jerrol Banana., MD [address] [place], [place] [address]  Chief Complaint  Patient presents with  . Urinary Urgency    HPI: The patient is a 66 year old gentleman who presents today for follow up  1. OAB Patient's biggest complaint is urinary urgency with occasional urge incontinence. He denies nocturia. He has a good stream. He has no intermittency. He feels like he empties his bladder. Denies frequency.  He was started on Myrbetriq at his last visit for the above.  He did not like how it made him feel (especially due to headaches) so this was stopped.  He has been on Flomax which did not alleviate his irritative voiding symptoms  2. ED Has been on sildenafil in the past.  3. Hypogonadism  He has been on Axiron in the past. He noticed no change in his symptoms with this medication. His testosterone levels never seemed to rise with this medication. His only complaint though is erectile dysfunction which is well managed with sildenafil. He denies any significant fatigue or weight gain.  4. Prostate cancer screening PSA 0.9 in Feb 2018. DRE 2+ benign at that time.   PMH: Past Medical History:  Diagnosis Date  . Asthma   . Cancer ([facility])   . COPD (chronic obstructive pulmonary disease) ([place])   . Diabetes mellitus without complication ([place])   . GERD (gastroesophageal reflux disease)   . Hypertension   . Lipids blood increased   . Multiple allergies   . Restless leg syndrome   . Rhinitis, allergic     Surgical History: Past Surgical History:  Procedure Laterality Date  . COLONOSCOPY WITH PROPOFOL N/A 09/11/2015   Procedure: COLONOSCOPY WITH PROPOFOL;  Surgeon: Josefine Class, MD;  Location: [facility] ENDOSCOPY;  Service: Endoscopy;  Laterality: N/A;  . NASAL SINUS SURGERY  2009   Due to sinus infection, breathing problems and passage was " messed up"  . SKIN  CANCER EXCISION  12/2011   basal cell carcinoma  . VASECTOMY  1985  . VASECTOMY      Home Medications:  Allergies as of 10/12/2017      Reactions   Amlodipine Swelling   Edema-resolved after stopping this medication      Medication List        Accurate as of 10/12/17 10:46 AM. Always use your most recent med list.          albuterol 108 (90 Base) MCG/ACT inhaler Commonly known as:  VENTOLIN HFA Inhale 1-2 puffs into the lungs every 6 (six) hours as needed for wheezing or shortness of breath.   Aspirin 81 MG EC tablet Take by mouth. Reported on 01/14/2016   carvedilol 25 MG tablet Commonly known as:  COREG Take 1 tablet (25 mg total) by mouth 2 (two) times daily.   fluticasone 50 MCG/ACT nasal spray Commonly known as:  FLONASE Place 1 spray into both nostrils daily.   hydrochlorothiazide 25 MG tablet Commonly known as:  HYDRODIURIL Take 1 tablet by mouth  daily   losartan 100 MG tablet Commonly known as:  COZAAR Take 1 tablet (100 mg total) by mouth daily.   metFORMIN 1000 MG tablet Commonly known as:  GLUCOPHAGE Take 1 tablet (1,000 mg total) by mouth 2 (two) times daily.   MULTIPLE VITAMIN PO Take by mouth.   omeprazole 20 MG capsule Commonly known as:  PRILOSEC Take 1 capsule (20 mg total) by  mouth daily.   SINGULAIR 10 MG tablet Generic drug:  montelukast Take by mouth.   tolterodine 4 MG 24 hr capsule Commonly known as:  DETROL LA Take 1 capsule (4 mg total) daily by mouth.       Allergies:  Allergies  Allergen Reactions  . Amlodipine Swelling    Edema-resolved after stopping this medication    Family History: Family History  Problem Relation Age of Onset  . Diabetes Mother   . Hypertension Mother   . Prostate cancer Father   . Asthma Sister   . Heart disease Brother   . Bladder Cancer Neg Hx   . Kidney cancer Neg Hx     Social History:  reports that  has never smoked. he has never used smokeless tobacco. He reports that he  drinks alcohol. He reports that he does not use drugs.  ROS:                                        Physical Exam: BP 138/79 (BP Location: Right Arm, Patient Position: Sitting, Cuff Size: Normal)   Pulse 64   Ht 6\' 2"  (1.88 m)   Wt 228 lb 12.8 oz (103.8 kg)   BMI 29.38 kg/m   Constitutional:  Alert and oriented, No acute distress. HEENT: [place] AT, moist mucus membranes.  Trachea midline, no masses. Cardiovascular: No clubbing, cyanosis, or edema. Respiratory: Normal respiratory effort, no increased work of breathing. GI: Abdomen is soft, nontender, nondistended, no abdominal masses GU: No CVA tenderness.  Skin: No rashes, bruises or suspicious lesions. Lymph: No cervical or inguinal adenopathy. Neurologic: Grossly intact, no focal deficits, moving all 4 extremities. Psychiatric: Normal mood and affect.  Laboratory Data: Lab Results  Component Value Date   WBC 5.3 10/06/2017   HGB 14.8 10/06/2017   HCT 43.8 10/06/2017   MCV 98 (H) 10/06/2017   PLT 248 10/06/2017    Lab Results  Component Value Date   CREATININE 0.86 10/06/2017    Lab Results  Component Value Date   PSA 1.2 04/27/2016    Lab Results  Component Value Date   TESTOSTERONE 271 04/11/2017    Lab Results  Component Value Date   HGBA1C 5.9 04/17/2017    Urinalysis    Component Value Date/Time   APPEARANCEUR Clear 12/29/2016 0957   GLUCOSEU Negative 12/29/2016 0957   BILIRUBINUR Negative 12/29/2016 0957   PROTEINUR Negative 12/29/2016 0957   NITRITE Negative 12/29/2016 0957   LEUKOCYTESUR Negative 12/29/2016 0957    Assessment & Plan:    1. OAB We will try the patient on Detrol LA 4 mg as it appears his insurance will cover this for a low copay.  I I have however given him samples of Toviaz 4 mg to try prior to picking up his prescription.  He will follow up in 3 months to assess his prior  2. Prostate cancer screening Up to date. Will need repeat PSA/DRE in February  2019  Return in about 3 months (around 01/12/2018).  Nickie Retort, MD  [facility] [address] [place], [place] [address] [phone]

## 2017-11-01 DIAGNOSIS — D485 Neoplasm of uncertain behavior of skin: Secondary | ICD-10-CM | POA: Diagnosis not present

## 2017-11-01 DIAGNOSIS — Z85828 Personal history of other malignant neoplasm of skin: Secondary | ICD-10-CM | POA: Diagnosis not present

## 2017-11-01 DIAGNOSIS — D0472 Carcinoma in situ of skin of left lower limb, including hip: Secondary | ICD-10-CM | POA: Diagnosis not present

## 2017-11-01 DIAGNOSIS — D2261 Melanocytic nevi of right upper limb, including shoulder: Secondary | ICD-10-CM | POA: Diagnosis not present

## 2017-11-01 DIAGNOSIS — D225 Melanocytic nevi of trunk: Secondary | ICD-10-CM | POA: Diagnosis not present

## 2017-11-01 DIAGNOSIS — C44712 Basal cell carcinoma of skin of right lower limb, including hip: Secondary | ICD-10-CM | POA: Diagnosis not present

## 2017-11-01 DIAGNOSIS — L570 Actinic keratosis: Secondary | ICD-10-CM | POA: Diagnosis not present

## 2017-11-01 DIAGNOSIS — D2272 Melanocytic nevi of left lower limb, including hip: Secondary | ICD-10-CM | POA: Diagnosis not present

## 2017-11-01 DIAGNOSIS — C44719 Basal cell carcinoma of skin of left lower limb, including hip: Secondary | ICD-10-CM | POA: Diagnosis not present

## 2017-11-01 DIAGNOSIS — X32XXXA Exposure to sunlight, initial encounter: Secondary | ICD-10-CM | POA: Diagnosis not present

## 2017-11-02 ENCOUNTER — Ambulatory Visit: Payer: PPO | Admitting: Family Medicine

## 2017-11-02 ENCOUNTER — Other Ambulatory Visit: Payer: Self-pay

## 2017-11-02 VITALS — BP 122/78 | HR 72 | Temp 98.0°F | Resp 16 | Wt 234.0 lb

## 2017-11-02 DIAGNOSIS — J4540 Moderate persistent asthma, uncomplicated: Secondary | ICD-10-CM | POA: Diagnosis not present

## 2017-11-02 DIAGNOSIS — R739 Hyperglycemia, unspecified: Secondary | ICD-10-CM | POA: Diagnosis not present

## 2017-11-02 DIAGNOSIS — J45909 Unspecified asthma, uncomplicated: Secondary | ICD-10-CM | POA: Diagnosis not present

## 2017-11-02 DIAGNOSIS — R718 Other abnormality of red blood cells: Secondary | ICD-10-CM

## 2017-11-02 DIAGNOSIS — E1140 Type 2 diabetes mellitus with diabetic neuropathy, unspecified: Secondary | ICD-10-CM

## 2017-11-02 DIAGNOSIS — K219 Gastro-esophageal reflux disease without esophagitis: Secondary | ICD-10-CM

## 2017-11-02 DIAGNOSIS — I10 Essential (primary) hypertension: Secondary | ICD-10-CM | POA: Diagnosis not present

## 2017-11-02 DIAGNOSIS — E785 Hyperlipidemia, unspecified: Secondary | ICD-10-CM

## 2017-11-02 DIAGNOSIS — N400 Enlarged prostate without lower urinary tract symptoms: Secondary | ICD-10-CM | POA: Diagnosis not present

## 2017-11-02 MED ORDER — BUDESONIDE-FORMOTEROL FUMARATE 160-4.5 MCG/ACT IN AERO: 2.0000 | INHALATION_SPRAY | Freq: Two times a day (BID) | RESPIRATORY_TRACT | 3 refills | Status: AC

## 2017-11-02 MED ORDER — FLUTICASONE PROPIONATE 50 MCG/ACT NA SUSP: 1.0000 | Freq: Every day | NASAL | 3 refills | Status: AC

## 2017-11-02 MED ORDER — TOLTERODINE TARTRATE ER 4 MG PO CP24: 4.0000 mg | ORAL_CAPSULE | Freq: Every day | ORAL | 1 refills | Status: AC

## 2017-11-02 MED ORDER — MONTELUKAST SODIUM 10 MG PO TABS: 10.0000 mg | ORAL_TABLET | Freq: Every day | ORAL | 1 refills | Status: AC

## 2017-11-02 MED ORDER — ALBUTEROL SULFATE HFA 108 (90 BASE) MCG/ACT IN AERS: 1.0000 | INHALATION_SPRAY | Freq: Four times a day (QID) | RESPIRATORY_TRACT | 1 refills | Status: AC | PRN

## 2017-11-02 MED ORDER — METFORMIN HCL 1000 MG PO TABS: 1000.0000 mg | ORAL_TABLET | Freq: Two times a day (BID) | ORAL | 1 refills | Status: DC

## 2017-11-02 MED ORDER — CARVEDILOL 25 MG PO TABS
25.0000 mg | ORAL_TABLET | Freq: Two times a day (BID) | ORAL | 1 refills | Status: DC
Start: 2017-11-02 — End: 2018-08-10

## 2017-11-02 MED ORDER — HYDROCHLOROTHIAZIDE 25 MG PO TABS: ORAL_TABLET | ORAL | 1 refills | Status: DC

## 2017-11-02 MED ORDER — LOSARTAN POTASSIUM 100 MG PO TABS: 100.0000 mg | ORAL_TABLET | Freq: Every day | ORAL | 1 refills | Status: DC

## 2017-11-02 MED ORDER — OMEPRAZOLE 20 MG PO CPDR: 20.0000 mg | DELAYED_RELEASE_CAPSULE | Freq: Every day | ORAL | 1 refills | Status: DC

## 2017-11-02 NOTE — Progress Notes (Signed)
Brian Macdonald  MRN: [id number] DOB: [birth year]  Subjective:  HPI   The patient is a 66 year old male who presents for follow up of chronic illness.  He was last seen on 09/06/17 for his annual physical and had labs done at that time.  Based on his lab results it is to be considered to have B12 level checked on today's visit.  Diabetes-the patient had his A1C last on 04/17/17 and it was 5.9.  Patient is due for A1C today.  Patient would like to discuss the inhaler (Symbicort) that was given to him on the last visit.  He states that when he uses it as directed he looses his voice.  The patient states that he was prescribed Detrol by the urologist.  There were no samples of that so he was given samples of Toviaz but had the Detrol sent to the pharmacy.  He complains that it dries him out too much and he is not able to take it as directed.  [place].  Patient Active Problem List   Diagnosis Date Noted  . Ventral hernia 01/14/2016  . Mild intermittent asthma without complication 27/78/2423  . Asthma with allergic rhinitis 06/06/2015  . At risk for osteoporosis 06/06/2015  . Benign prostatic hypertrophy without urinary obstruction 06/06/2015  . Diabetes mellitus, type 2 ([facility]) 06/06/2015  . Essential (primary) hypertension 06/06/2015  . Gastro-esophageal reflux disease without esophagitis 06/06/2015  . HLD (hyperlipidemia) 06/06/2015  . Failure of erection 06/06/2015  . Snores 06/06/2015  . Testicular hypofunction 06/06/2015  . Essential hypertension 11/02/2007  . SINUSITIS CHRONIC, UNSPECIFIED 11/02/2007  . ALLERGIC RHINITIS 11/02/2007    Past Medical History:  Diagnosis Date  . Asthma   . Cancer ([place])   . COPD (chronic obstructive pulmonary disease) ([place])   . Diabetes mellitus without complication ([place])   . GERD (gastroesophageal reflux disease)   . Hypertension   . Lipids blood increased   . Multiple allergies    . Restless leg syndrome   . Rhinitis, allergic     Social History   Socioeconomic History  . Marital status: Married    Spouse name: Not on file  . Number of children: Not on file  . Years of education: Not on file  . Highest education level: Not on file  Social Needs  . Financial resource strain: Not on file  . Food insecurity - worry: Not on file  . Food insecurity - inability: Not on file  . Transportation needs - medical: Not on file  . Transportation needs - non-medical: Not on file  Occupational History  . Not on file  Tobacco Use  . Smoking status: Never Smoker  . Smokeless tobacco: Never Used  Substance and Sexual Activity  . Alcohol use: Yes    Comment: "about every day" " Doctor told me to have 2 drinks a night"  . Drug use: No  . Sexual activity: Not on file  Other Topics Concern  . Not on file  Social History Narrative  . Not on file    Outpatient Encounter Medications as of 11/02/2017  Medication Sig Note  . albuterol (VENTOLIN HFA) 108 (90 Base) MCG/ACT inhaler Inhale 1-2 puffs into the lungs every 6 (six) hours as needed for wheezing or shortness of breath.   . Aspirin 81 MG EC tablet Take by mouth. Reported on 01/14/2016 06/06/2015: Received from: [facility]  . carvedilol (  COREG) 25 MG tablet Take 1 tablet (25 mg total) by mouth 2 (two) times daily.   . fluticasone (FLONASE) 50 MCG/ACT nasal spray Place 1 spray into both nostrils daily.   . hydrochlorothiazide (HYDRODIURIL) 25 MG tablet Take 1 tablet by mouth  daily   . losartan (COZAAR) 100 MG tablet Take 1 tablet (100 mg total) by mouth daily.   . metFORMIN (GLUCOPHAGE) 1000 MG tablet Take 1 tablet (1,000 mg total) by mouth 2 (two) times daily.   . montelukast (SINGULAIR) 10 MG tablet Take by mouth. 06/06/2015: Received from: [facility]  . MULTIPLE VITAMIN PO Take by mouth. 06/06/2015: Received from: [facility]  . omeprazole (PRILOSEC) 20 MG capsule Take  1 capsule (20 mg total) by mouth daily.   Marland Kitchen tolterodine (DETROL LA) 4 MG 24 hr capsule Take 1 capsule (4 mg total) daily by mouth.    No facility-administered encounter medications on file as of 11/02/2017.     Allergies  Allergen Reactions  . Amlodipine Swelling    Edema-resolved after stopping this medication    Review of Systems  Constitutional: Negative for fever and malaise/fatigue.  Eyes: Negative.   Respiratory: Positive for wheezing. Negative for cough and shortness of breath.   Cardiovascular: Negative for chest pain, palpitations, orthopnea, claudication and leg swelling.  Gastrointestinal: Negative.   Skin: Negative.   Neurological: Negative.  Negative for weakness.  Endo/Heme/Allergies: Negative.   Psychiatric/Behavioral: Negative.     Objective:  BP 122/78 (BP Location: Right Arm, Patient Position: Sitting, Cuff Size: Normal)   Pulse 72   Temp 98 F (36.7 C) (Oral)   Resp 16   Wt 234 lb (106.1 kg)   BMI 30.04 kg/m   Physical Exam  Constitutional: He is oriented to person, place, and time and well-developed, well-nourished, and in no distress.  HENT:  Head: Normocephalic and atraumatic.  Eyes: Conjunctivae are normal. No scleral icterus.  Neck: No thyromegaly present.  Cardiovascular: Normal rate, regular rhythm and normal heart sounds.  Pulmonary/Chest: Effort normal and breath sounds normal.  Abdominal: Soft.  Neurological: He is alert and oriented to person, place, and time. Gait normal. GCS score is 15.  Skin: Skin is warm and dry.  Psychiatric: Mood, affect and judgment normal.    Assessment and Plan :  Hyperglycemia AR with Asthma HTN HLD Pt [place] physician [place]  I have done the exam and reviewed the chart and it is accurate to the best of my knowledge. Development worker, community has been used and  any errors in dictation or transcription are unintentional. Miguel Aschoff M.D. [facility]

## 2017-11-03 LAB — SPECIMEN STATUS REPORT

## 2017-11-03 LAB — HEMOGLOBIN A1C
ESTIMATED AVERAGE GLUCOSE: 143 mg/dL
HEMOGLOBIN A1C: 6.6 % — AB (ref 4.8–5.6)

## 2017-11-03 LAB — VITAMIN B12: Vitamin B-12: 1371 pg/mL — ABNORMAL HIGH (ref 232–1245)

## 2017-11-14 ENCOUNTER — Encounter: Payer: Self-pay | Admitting: Family Medicine

## 2017-11-16 DIAGNOSIS — C44712 Basal cell carcinoma of skin of right lower limb, including hip: Secondary | ICD-10-CM | POA: Diagnosis not present

## 2017-11-16 DIAGNOSIS — C44719 Basal cell carcinoma of skin of left lower limb, including hip: Secondary | ICD-10-CM | POA: Diagnosis not present

## 2017-11-16 DIAGNOSIS — D0472 Carcinoma in situ of skin of left lower limb, including hip: Secondary | ICD-10-CM | POA: Diagnosis not present

## 2018-01-17 DIAGNOSIS — R062 Wheezing: Secondary | ICD-10-CM | POA: Diagnosis not present

## 2018-01-17 DIAGNOSIS — Z7982 Long term (current) use of aspirin: Secondary | ICD-10-CM | POA: Diagnosis not present

## 2018-01-17 DIAGNOSIS — I10 Essential (primary) hypertension: Secondary | ICD-10-CM | POA: Diagnosis not present

## 2018-01-17 DIAGNOSIS — J9801 Acute bronchospasm: Secondary | ICD-10-CM | POA: Diagnosis not present

## 2018-01-17 DIAGNOSIS — E119 Type 2 diabetes mellitus without complications: Secondary | ICD-10-CM | POA: Diagnosis not present

## 2018-01-17 DIAGNOSIS — Z7984 Long term (current) use of oral hypoglycemic drugs: Secondary | ICD-10-CM | POA: Diagnosis not present

## 2018-01-17 DIAGNOSIS — Z79899 Other long term (current) drug therapy: Secondary | ICD-10-CM | POA: Diagnosis not present

## 2018-05-22 DIAGNOSIS — J309 Allergic rhinitis, unspecified: Secondary | ICD-10-CM | POA: Diagnosis not present

## 2018-05-22 DIAGNOSIS — K219 Gastro-esophageal reflux disease without esophagitis: Secondary | ICD-10-CM | POA: Diagnosis not present

## 2018-05-22 DIAGNOSIS — I10 Essential (primary) hypertension: Secondary | ICD-10-CM | POA: Diagnosis not present

## 2018-05-22 DIAGNOSIS — R202 Paresthesia of skin: Secondary | ICD-10-CM | POA: Diagnosis not present

## 2018-05-22 DIAGNOSIS — E119 Type 2 diabetes mellitus without complications: Secondary | ICD-10-CM | POA: Diagnosis not present

## 2018-05-22 DIAGNOSIS — K439 Ventral hernia without obstruction or gangrene: Secondary | ICD-10-CM | POA: Diagnosis not present

## 2018-05-22 DIAGNOSIS — J4520 Mild intermittent asthma, uncomplicated: Secondary | ICD-10-CM | POA: Diagnosis not present

## 2018-05-22 DIAGNOSIS — K649 Unspecified hemorrhoids: Secondary | ICD-10-CM | POA: Diagnosis not present

## 2018-05-22 DIAGNOSIS — M25562 Pain in left knee: Secondary | ICD-10-CM | POA: Diagnosis not present

## 2018-05-26 ENCOUNTER — Other Ambulatory Visit: Payer: Self-pay | Admitting: Family Medicine

## 2018-05-26 DIAGNOSIS — I10 Essential (primary) hypertension: Secondary | ICD-10-CM

## 2018-07-25 ENCOUNTER — Telehealth: Payer: Self-pay

## 2018-07-25 NOTE — Telephone Encounter (Signed)
LMTCB and schedule AWV. Last AWV was 08/31/17. -MM

## 2018-08-01 NOTE — Telephone Encounter (Signed)
Spoke with pt who states he has moved away and has another PCP. Pt states Dr Rosanna Randy is aware of this.  -MM

## 2018-08-10 ENCOUNTER — Other Ambulatory Visit: Payer: Self-pay | Admitting: Family Medicine

## 2018-08-10 DIAGNOSIS — I10 Essential (primary) hypertension: Secondary | ICD-10-CM

## 2018-09-19 DIAGNOSIS — R062 Wheezing: Secondary | ICD-10-CM | POA: Diagnosis not present

## 2018-10-11 DIAGNOSIS — J069 Acute upper respiratory infection, unspecified: Secondary | ICD-10-CM | POA: Diagnosis not present

## 2018-10-17 DIAGNOSIS — R69 Illness, unspecified: Secondary | ICD-10-CM | POA: Diagnosis not present

## 2018-10-24 ENCOUNTER — Telehealth: Payer: Self-pay

## 2018-10-29 NOTE — Telephone Encounter (Signed)
Opened in error

## 2018-10-30 DIAGNOSIS — Z0000 Encounter for general adult medical examination without abnormal findings: Secondary | ICD-10-CM | POA: Diagnosis not present

## 2018-10-30 DIAGNOSIS — Z125 Encounter for screening for malignant neoplasm of prostate: Secondary | ICD-10-CM | POA: Diagnosis not present

## 2018-10-30 DIAGNOSIS — K219 Gastro-esophageal reflux disease without esophagitis: Secondary | ICD-10-CM | POA: Diagnosis not present

## 2018-10-30 DIAGNOSIS — Z1159 Encounter for screening for other viral diseases: Secondary | ICD-10-CM | POA: Diagnosis not present

## 2018-10-30 DIAGNOSIS — I10 Essential (primary) hypertension: Secondary | ICD-10-CM | POA: Diagnosis not present

## 2018-10-30 DIAGNOSIS — E119 Type 2 diabetes mellitus without complications: Secondary | ICD-10-CM | POA: Diagnosis not present

## 2018-10-30 DIAGNOSIS — Z1389 Encounter for screening for other disorder: Secondary | ICD-10-CM | POA: Diagnosis not present

## 2018-10-30 DIAGNOSIS — J4520 Mild intermittent asthma, uncomplicated: Secondary | ICD-10-CM | POA: Diagnosis not present

## 2018-10-31 ENCOUNTER — Other Ambulatory Visit: Payer: Self-pay | Admitting: Family Medicine

## 2018-10-31 DIAGNOSIS — K219 Gastro-esophageal reflux disease without esophagitis: Secondary | ICD-10-CM

## 2018-11-09 DIAGNOSIS — R69 Illness, unspecified: Secondary | ICD-10-CM | POA: Diagnosis not present

## 2018-11-16 DIAGNOSIS — R0989 Other specified symptoms and signs involving the circulatory and respiratory systems: Secondary | ICD-10-CM | POA: Diagnosis not present

## 2018-11-16 DIAGNOSIS — R062 Wheezing: Secondary | ICD-10-CM | POA: Diagnosis not present

## 2018-11-16 DIAGNOSIS — J209 Acute bronchitis, unspecified: Secondary | ICD-10-CM | POA: Diagnosis not present

## 2018-11-16 DIAGNOSIS — R05 Cough: Secondary | ICD-10-CM | POA: Diagnosis not present

## 2018-12-24 DIAGNOSIS — K219 Gastro-esophageal reflux disease without esophagitis: Secondary | ICD-10-CM | POA: Diagnosis not present

## 2018-12-24 DIAGNOSIS — J4530 Mild persistent asthma, uncomplicated: Secondary | ICD-10-CM | POA: Diagnosis not present

## 2018-12-24 DIAGNOSIS — J309 Allergic rhinitis, unspecified: Secondary | ICD-10-CM | POA: Diagnosis not present

## 2019-01-18 ENCOUNTER — Other Ambulatory Visit: Payer: Self-pay | Admitting: Family Medicine

## 2019-02-15 ENCOUNTER — Other Ambulatory Visit: Payer: Self-pay | Admitting: Family Medicine

## 2019-02-15 DIAGNOSIS — I10 Essential (primary) hypertension: Secondary | ICD-10-CM

## 2019-02-22 ENCOUNTER — Other Ambulatory Visit: Payer: Self-pay | Admitting: Family Medicine

## 2019-02-22 DIAGNOSIS — I10 Essential (primary) hypertension: Secondary | ICD-10-CM

## 2019-04-17 ENCOUNTER — Other Ambulatory Visit: Payer: Self-pay | Admitting: Family Medicine

## 2019-05-20 ENCOUNTER — Other Ambulatory Visit: Payer: Self-pay | Admitting: Family Medicine

## 2019-05-20 DIAGNOSIS — I10 Essential (primary) hypertension: Secondary | ICD-10-CM

## 2019-05-24 ENCOUNTER — Other Ambulatory Visit: Payer: Self-pay | Admitting: Family Medicine

## 2019-05-24 DIAGNOSIS — I10 Essential (primary) hypertension: Secondary | ICD-10-CM

## 2019-07-10 ENCOUNTER — Other Ambulatory Visit: Payer: Self-pay | Admitting: Family Medicine

## 2019-07-19 ENCOUNTER — Telehealth: Payer: Self-pay | Admitting: Family Medicine

## 2019-07-26 DIAGNOSIS — R69 Illness, unspecified: Secondary | ICD-10-CM | POA: Diagnosis not present

## 2019-08-08 DIAGNOSIS — R69 Illness, unspecified: Secondary | ICD-10-CM | POA: Diagnosis not present

## 2019-10-08 DIAGNOSIS — H401231 Low-tension glaucoma, bilateral, mild stage: Secondary | ICD-10-CM | POA: Diagnosis not present

## 2019-10-08 DIAGNOSIS — H4321 Crystalline deposits in vitreous body, right eye: Secondary | ICD-10-CM | POA: Diagnosis not present

## 2019-10-08 DIAGNOSIS — H40023 Open angle with borderline findings, high risk, bilateral: Secondary | ICD-10-CM | POA: Diagnosis not present

## 2019-10-17 ENCOUNTER — Other Ambulatory Visit: Payer: Self-pay | Admitting: Family Medicine

## 2019-10-17 DIAGNOSIS — I10 Essential (primary) hypertension: Secondary | ICD-10-CM

## 2019-10-17 NOTE — Telephone Encounter (Signed)
Requested medication (s) are due for refill today: yes  Requested medication (s) are on the active medication list: yes  Last refill:  08/14/2019  Future visit scheduled: no  Notes to clinic:  Overdue for office visit  Review for refill  Requested Prescriptions  Pending Prescriptions Disp Refills   hydrochlorothiazide (HYDRODIURIL) 25 MG tablet [Pharmacy Med Name: HYDROCHLOROTHIAZIDE 25 MG TAB] 60 tablet 0    Sig: TAKE 1 TABLET BY MOUTH EVERY DAY     Cardiovascular: Diuretics - Thiazide Failed - 10/17/2019 10:14 AM      Failed - Ca in normal range and within 360 days    Calcium  Date Value Ref Range Status  10/06/2017 9.7 8.6 - 10.2 mg/dL Final         Failed - Cr in normal range and within 360 days    Creatinine, Ser  Date Value Ref Range Status  10/06/2017 0.86 0.76 - 1.27 mg/dL Final         Failed - K in normal range and within 360 days    Potassium  Date Value Ref Range Status  10/06/2017 4.6 3.5 - 5.2 mmol/L Final         Failed - Na in normal range and within 360 days    Sodium  Date Value Ref Range Status  10/06/2017 137 134 - 144 mmol/L Final         Failed - Valid encounter within last 6 months    Recent Outpatient Visits          1 year ago Asthma with allergic rhinitis, unspecified asthma severity, uncomplicated   [facility] Jerrol Banana., MD   2 years ago Annual physical exam   [facility] Jerrol Banana., MD   2 years ago Hyperglycemia   [facility] Jerrol Banana., MD   2 years ago Type 2 diabetes mellitus with stage 1 chronic kidney disease, unspecified long term insulin use status [facility])   [facility] Jerrol Banana., MD   3 years ago Annual physical exam   [facility] Jerrol Banana., MD             Passed - Last BP in normal range    BP Readings from Last 1 Encounters:  11/02/17 122/78

## 2019-10-18 DIAGNOSIS — H4321 Crystalline deposits in vitreous body, right eye: Secondary | ICD-10-CM | POA: Diagnosis not present

## 2019-10-18 DIAGNOSIS — H401231 Low-tension glaucoma, bilateral, mild stage: Secondary | ICD-10-CM | POA: Diagnosis not present

## 2019-10-18 DIAGNOSIS — H40023 Open angle with borderline findings, high risk, bilateral: Secondary | ICD-10-CM | POA: Diagnosis not present

## 2019-10-21 DIAGNOSIS — J310 Chronic rhinitis: Secondary | ICD-10-CM | POA: Diagnosis not present

## 2019-10-21 DIAGNOSIS — R070 Pain in throat: Secondary | ICD-10-CM | POA: Diagnosis not present

## 2019-11-11 DIAGNOSIS — M778 Other enthesopathies, not elsewhere classified: Secondary | ICD-10-CM | POA: Diagnosis not present

## 2019-11-13 DIAGNOSIS — R05 Cough: Secondary | ICD-10-CM | POA: Diagnosis not present

## 2019-11-13 DIAGNOSIS — J3489 Other specified disorders of nose and nasal sinuses: Secondary | ICD-10-CM | POA: Diagnosis not present

## 2019-11-13 DIAGNOSIS — R0982 Postnasal drip: Secondary | ICD-10-CM | POA: Diagnosis not present

## 2019-11-20 ENCOUNTER — Other Ambulatory Visit: Payer: Self-pay | Admitting: Family Medicine

## 2019-11-20 DIAGNOSIS — I10 Essential (primary) hypertension: Secondary | ICD-10-CM

## 2019-11-20 NOTE — Telephone Encounter (Signed)
Requested medication (s) are due for refill today: yes  Requested medication (s) are on the active medication list: yes  Last refill:  08/29/2019  Future visit scheduled: no  Notes to clinic:  no valid encounter within last 6 months   Requested Prescriptions  Pending Prescriptions Disp Refills   losartan (COZAAR) 100 MG tablet [Pharmacy Med Name: LOSARTAN POTASSIUM 100 MG TAB] 90 tablet 0    Sig: TAKE 1 TABLET BY MOUTH EVERY DAY      Cardiovascular:  Angiotensin Receptor Blockers Failed - 11/20/2019 12:54 AM      Failed - Cr in normal range and within 180 days    Creatinine, Ser  Date Value Ref Range Status  10/06/2017 0.86 0.76 - 1.27 mg/dL Final          Failed - K in normal range and within 180 days    Potassium  Date Value Ref Range Status  10/06/2017 4.6 3.5 - 5.2 mmol/L Final          Failed - Valid encounter within last 6 months    Recent Outpatient Visits           2 years ago Asthma with allergic rhinitis, unspecified asthma severity, uncomplicated   [facility] Jerrol Banana., MD   2 years ago Annual physical exam   [facility] Jerrol Banana., MD   2 years ago Hyperglycemia   [facility] Jerrol Banana., MD   3 years ago Type 2 diabetes mellitus with stage 1 chronic kidney disease, unspecified long term insulin use status [facility])   [facility] Jerrol Banana., MD   3 years ago Annual physical exam   [facility] Jerrol Banana., MD              Passed - Patient is not pregnant      Passed - Last BP in normal range    BP Readings from Last 1 Encounters:  11/02/17 122/78

## 2019-11-27 DIAGNOSIS — J3489 Other specified disorders of nose and nasal sinuses: Secondary | ICD-10-CM | POA: Diagnosis not present

## 2019-12-04 DIAGNOSIS — Z20822 Contact with and (suspected) exposure to covid-19: Secondary | ICD-10-CM | POA: Diagnosis not present

## 2019-12-24 DIAGNOSIS — H6983 Other specified disorders of Eustachian tube, bilateral: Secondary | ICD-10-CM | POA: Diagnosis not present

## 2019-12-24 DIAGNOSIS — R05 Cough: Secondary | ICD-10-CM | POA: Diagnosis not present

## 2019-12-24 DIAGNOSIS — J40 Bronchitis, not specified as acute or chronic: Secondary | ICD-10-CM | POA: Diagnosis not present

## 2019-12-24 DIAGNOSIS — R0982 Postnasal drip: Secondary | ICD-10-CM | POA: Diagnosis not present

## 2020-01-15 DIAGNOSIS — J3089 Other allergic rhinitis: Secondary | ICD-10-CM | POA: Diagnosis not present

## 2020-01-15 DIAGNOSIS — J4540 Moderate persistent asthma, uncomplicated: Secondary | ICD-10-CM | POA: Diagnosis not present

## 2020-01-15 DIAGNOSIS — K219 Gastro-esophageal reflux disease without esophagitis: Secondary | ICD-10-CM | POA: Diagnosis not present

## 2020-01-15 DIAGNOSIS — J301 Allergic rhinitis due to pollen: Secondary | ICD-10-CM | POA: Diagnosis not present

## 2020-02-20 DIAGNOSIS — Z1389 Encounter for screening for other disorder: Secondary | ICD-10-CM | POA: Diagnosis not present

## 2020-02-20 DIAGNOSIS — J4530 Mild persistent asthma, uncomplicated: Secondary | ICD-10-CM | POA: Diagnosis not present

## 2020-02-20 DIAGNOSIS — Z0000 Encounter for general adult medical examination without abnormal findings: Secondary | ICD-10-CM | POA: Diagnosis not present

## 2020-02-20 DIAGNOSIS — E119 Type 2 diabetes mellitus without complications: Secondary | ICD-10-CM | POA: Diagnosis not present

## 2020-03-03 DIAGNOSIS — Z0100 Encounter for examination of eyes and vision without abnormal findings: Secondary | ICD-10-CM | POA: Diagnosis not present

## 2020-03-18 DIAGNOSIS — J3089 Other allergic rhinitis: Secondary | ICD-10-CM | POA: Diagnosis not present

## 2020-03-18 DIAGNOSIS — J301 Allergic rhinitis due to pollen: Secondary | ICD-10-CM | POA: Diagnosis not present

## 2020-03-18 DIAGNOSIS — K219 Gastro-esophageal reflux disease without esophagitis: Secondary | ICD-10-CM | POA: Diagnosis not present

## 2020-03-18 DIAGNOSIS — J4540 Moderate persistent asthma, uncomplicated: Secondary | ICD-10-CM | POA: Diagnosis not present

## 2020-03-18 DIAGNOSIS — B370 Candidal stomatitis: Secondary | ICD-10-CM | POA: Diagnosis not present

## 2020-04-23 DIAGNOSIS — Z5111 Encounter for antineoplastic chemotherapy: Secondary | ICD-10-CM | POA: Diagnosis not present

## 2020-04-23 DIAGNOSIS — R69 Illness, unspecified: Secondary | ICD-10-CM | POA: Diagnosis not present

## 2020-04-23 DIAGNOSIS — M5417 Radiculopathy, lumbosacral region: Secondary | ICD-10-CM | POA: Diagnosis not present

## 2020-06-09 DIAGNOSIS — Z0100 Encounter for examination of eyes and vision without abnormal findings: Secondary | ICD-10-CM | POA: Diagnosis not present

## 2020-07-08 DIAGNOSIS — J301 Allergic rhinitis due to pollen: Secondary | ICD-10-CM | POA: Diagnosis not present

## 2020-07-08 DIAGNOSIS — K219 Gastro-esophageal reflux disease without esophagitis: Secondary | ICD-10-CM | POA: Diagnosis not present

## 2020-07-08 DIAGNOSIS — J3089 Other allergic rhinitis: Secondary | ICD-10-CM | POA: Diagnosis not present

## 2020-07-08 DIAGNOSIS — J4540 Moderate persistent asthma, uncomplicated: Secondary | ICD-10-CM | POA: Diagnosis not present

## 2020-10-05 DIAGNOSIS — H401231 Low-tension glaucoma, bilateral, mild stage: Secondary | ICD-10-CM | POA: Diagnosis not present

## 2020-10-16 DIAGNOSIS — R69 Illness, unspecified: Secondary | ICD-10-CM | POA: Diagnosis not present

## 2020-10-29 ENCOUNTER — Telehealth: Payer: Self-pay

## 2020-10-29 NOTE — Telephone Encounter (Signed)
Copied from [place] [id number]. Topic: General - Other >> Oct 29, 2020  2:23 PM Keene Breath wrote: Reason for CRM: Patient called to inform the office to delete him from receiving automated messages because he is no longer a patient and has moved out of town over 3 years ago.

## 2020-10-30 NOTE — Telephone Encounter (Signed)
Please advise?

## 2020-11-03 DIAGNOSIS — E119 Type 2 diabetes mellitus without complications: Secondary | ICD-10-CM | POA: Diagnosis not present

## 2020-11-03 DIAGNOSIS — Z136 Encounter for screening for cardiovascular disorders: Secondary | ICD-10-CM | POA: Diagnosis not present

## 2020-11-03 DIAGNOSIS — R351 Nocturia: Secondary | ICD-10-CM | POA: Diagnosis not present

## 2020-11-03 DIAGNOSIS — I10 Essential (primary) hypertension: Secondary | ICD-10-CM | POA: Diagnosis not present

## 2020-11-06 NOTE — Telephone Encounter (Signed)
Have sent email to have him removed from the [facility] calls.

## 2021-01-27 DIAGNOSIS — J3089 Other allergic rhinitis: Secondary | ICD-10-CM | POA: Diagnosis not present

## 2021-01-27 DIAGNOSIS — J301 Allergic rhinitis due to pollen: Secondary | ICD-10-CM | POA: Diagnosis not present

## 2021-01-27 DIAGNOSIS — J4520 Mild intermittent asthma, uncomplicated: Secondary | ICD-10-CM | POA: Diagnosis not present

## 2021-01-27 DIAGNOSIS — K219 Gastro-esophageal reflux disease without esophagitis: Secondary | ICD-10-CM | POA: Diagnosis not present

## 2021-02-22 DIAGNOSIS — E119 Type 2 diabetes mellitus without complications: Secondary | ICD-10-CM | POA: Diagnosis not present

## 2021-02-22 DIAGNOSIS — I10 Essential (primary) hypertension: Secondary | ICD-10-CM | POA: Diagnosis not present

## 2021-02-22 DIAGNOSIS — M25512 Pain in left shoulder: Secondary | ICD-10-CM | POA: Diagnosis not present

## 2021-02-22 DIAGNOSIS — J4530 Mild persistent asthma, uncomplicated: Secondary | ICD-10-CM | POA: Diagnosis not present

## 2021-04-05 DIAGNOSIS — I771 Stricture of artery: Secondary | ICD-10-CM | POA: Diagnosis not present

## 2021-04-05 DIAGNOSIS — I10 Essential (primary) hypertension: Secondary | ICD-10-CM | POA: Diagnosis not present

## 2021-04-06 DIAGNOSIS — L578 Other skin changes due to chronic exposure to nonionizing radiation: Secondary | ICD-10-CM | POA: Diagnosis not present

## 2021-04-06 DIAGNOSIS — D229 Melanocytic nevi, unspecified: Secondary | ICD-10-CM | POA: Diagnosis not present

## 2021-04-06 DIAGNOSIS — L821 Other seborrheic keratosis: Secondary | ICD-10-CM | POA: Diagnosis not present

## 2021-04-06 DIAGNOSIS — Z872 Personal history of diseases of the skin and subcutaneous tissue: Secondary | ICD-10-CM | POA: Diagnosis not present

## 2021-04-06 DIAGNOSIS — Z7189 Other specified counseling: Secondary | ICD-10-CM | POA: Diagnosis not present

## 2021-04-06 DIAGNOSIS — L814 Other melanin hyperpigmentation: Secondary | ICD-10-CM | POA: Diagnosis not present

## 2021-04-06 DIAGNOSIS — L570 Actinic keratosis: Secondary | ICD-10-CM | POA: Diagnosis not present

## 2021-05-26 DIAGNOSIS — I10 Essential (primary) hypertension: Secondary | ICD-10-CM | POA: Diagnosis not present

## 2021-05-26 DIAGNOSIS — Z0000 Encounter for general adult medical examination without abnormal findings: Secondary | ICD-10-CM | POA: Diagnosis not present

## 2021-05-26 DIAGNOSIS — E119 Type 2 diabetes mellitus without complications: Secondary | ICD-10-CM | POA: Diagnosis not present

## 2023-06-29 ENCOUNTER — Telehealth: Payer: Self-pay

## 2023-06-29 NOTE — Telephone Encounter (Signed)
LVM for patient to call back [phone], or to call PCP office to schedule follow up apt. AS, CMA
